# Patient Record
Sex: Male | Born: 1980
Health system: Southern US, Community
[De-identification: ages and names within clinical notes are randomized; demographics above are authoritative.]

## PROBLEM LIST (undated history)

## (undated) HISTORY — PX: NO PAST SURGERIES: SHX2092

---

## 2005-12-06 ENCOUNTER — Emergency Department (HOSPITAL_COMMUNITY): Admission: EM | Admit: 2005-12-06 | Discharge: 2005-12-06 | Payer: Self-pay | Admitting: Emergency Medicine

## 2009-05-02 ENCOUNTER — Emergency Department (HOSPITAL_COMMUNITY): Admission: EM | Admit: 2009-05-02 | Discharge: 2009-05-02 | Payer: Self-pay | Admitting: Emergency Medicine

## 2010-10-20 LAB — RAPID STREP SCREEN (MED CTR MEBANE ONLY): Streptococcus, Group A Screen (Direct): NEGATIVE

## 2013-11-26 ENCOUNTER — Ambulatory Visit (INDEPENDENT_AMBULATORY_CARE_PROVIDER_SITE_OTHER): Payer: 59 | Admitting: Family Medicine

## 2013-11-26 ENCOUNTER — Encounter: Payer: Self-pay | Admitting: Family Medicine

## 2013-11-26 VITALS — BP 132/94 | Ht 73.0 in | Wt 242.0 lb

## 2013-11-26 DIAGNOSIS — M7020 Olecranon bursitis, unspecified elbow: Secondary | ICD-10-CM

## 2013-11-26 DIAGNOSIS — M7030 Other bursitis of elbow, unspecified elbow: Secondary | ICD-10-CM

## 2013-11-26 NOTE — Progress Notes (Signed)
   Subjective:    Patient ID: Jeffrey Fischer, male    DOB: 1980/11/02, 33 y.o.   MRN: [id number]  HPILeft elbow pain and swelling for the past 2 months.   Swelled up two mo ago, does nt hurt  Larger at times  No otc meds  No hx in past  Review of Systems No other joint pain no injury. No fever no chills no rash ROS otherwise negative    Objective:   Physical Exam  Alert no apparent distress. Lungs clear. Heart regular in rhythm. H&T normal. Left elbow inflamed swollen. Nontender. Palpable fluid.  Patient was prepped draped anesthetized small amount of gel fluid expressed. Injected with half cc steroid Depo-Medrol and cc Xylocaine.    Assessment & Plan:  Impression chronic: Olecranon bursitis plan local measures discussed wellness exam encourage. WSL

## 2013-12-11 ENCOUNTER — Encounter: Payer: 59 | Admitting: Family Medicine

## 2014-02-16 ENCOUNTER — Ambulatory Visit (INDEPENDENT_AMBULATORY_CARE_PROVIDER_SITE_OTHER): Payer: 59 | Admitting: Family Medicine

## 2014-02-16 ENCOUNTER — Encounter: Payer: Self-pay | Admitting: Family Medicine

## 2014-02-16 VITALS — BP 112/74 | Temp 99.6°F | Ht 73.0 in | Wt 246.0 lb

## 2014-02-16 DIAGNOSIS — J329 Chronic sinusitis, unspecified: Secondary | ICD-10-CM

## 2014-02-16 MED ORDER — HYDROCODONE-HOMATROPINE 5-1.5 MG/5ML PO SYRP: ORAL_SOLUTION | ORAL | Status: DC

## 2014-02-16 MED ORDER — AMOXICILLIN-POT CLAVULANATE 875-125 MG PO TABS: 1.0000 | ORAL_TABLET | Freq: Two times a day (BID) | ORAL | Status: AC

## 2014-02-16 NOTE — Progress Notes (Signed)
   Subjective:    Patient ID: Jeffrey Fischer, male    DOB: May 12, 1981, 33 y.o.   MRN: [id number]  Cough This is a new problem. The current episode started in the past 7 days. The cough is non-productive. Associated symptoms include ear congestion, a fever, postnasal drip and shortness of breath.   mucinex prn, dinminished energy  otc cough med   Frontal headache. Low-grade fever. Diminished energy. Review of Systems  Constitutional: Positive for fever.  HENT: Positive for postnasal drip.   Respiratory: Positive for cough and shortness of breath.    No rash ROS otherwise negative    Objective:   Physical Exam  Alert mild malaise. Vitals reviewed. Temp 99.6 frontal maxillary tenderness. Left TM effusion noted pharynx normal. Neck supple lungs clear heart regular in rhythm.      Assessment & Plan:  Impression acute rhinosinusitis discussed plan Augmentin twice a day 10 days. Symptomatic care discussed. Patient states he has quit smoking. WSL

## 2015-05-06 ENCOUNTER — Encounter: Payer: 59 | Admitting: Family Medicine

## 2015-05-06 ENCOUNTER — Encounter: Payer: Self-pay | Admitting: Family Medicine

## 2015-05-06 ENCOUNTER — Ambulatory Visit (INDEPENDENT_AMBULATORY_CARE_PROVIDER_SITE_OTHER): Payer: 59 | Admitting: Family Medicine

## 2015-05-06 VITALS — BP 140/90 | Ht 73.0 in | Wt 252.0 lb

## 2015-05-06 DIAGNOSIS — Z0000 Encounter for general adult medical examination without abnormal findings: Secondary | ICD-10-CM

## 2015-05-06 DIAGNOSIS — R21 Rash and other nonspecific skin eruption: Secondary | ICD-10-CM | POA: Diagnosis not present

## 2015-05-06 DIAGNOSIS — E785 Hyperlipidemia, unspecified: Secondary | ICD-10-CM | POA: Diagnosis not present

## 2015-05-06 MED ORDER — MUPIROCIN 2 % EX OINT: TOPICAL_OINTMENT | CUTANEOUS | Status: DC

## 2015-05-06 MED ORDER — DOXYCYCLINE HYCLATE 100 MG PO TABS: 100.0000 mg | ORAL_TABLET | Freq: Two times a day (BID) | ORAL | Status: DC

## 2015-05-06 NOTE — Progress Notes (Signed)
   Subjective:    Patient ID: Jeffrey Fischer, male    DOB: [birth year], 34 y.o.   MRN: [id number]  HPI The patient comes in today for a wellness visit.    A review of their health history was completed.  A review of medications was also completed.  Any needed refills; no  Eating habits: not so good  Falls/  MVA accidents in past few months: no  Regular exercise: no  Specialist pt sees on regular basis: no  Preventative health issues were discussed.   Additional concerns: none   Stays very active working out a lot  Needs to have chol cked  Frequent boils, flares up in the groin area  1 has cropped up lately more aggravating. No obvious discharge.  Positive history of hyperlipidemia. Very strong family history of coronary artery disease. Patient wishes to assess level of numbers at this time.   Review of Systems  Constitutional: Negative for fever, activity change and appetite change.  HENT: Negative for congestion and rhinorrhea.   Eyes: Negative for discharge.  Respiratory: Negative for cough and wheezing.   Cardiovascular: Negative for chest pain.  Gastrointestinal: Negative for vomiting, abdominal pain and blood in stool.  Genitourinary: Negative for frequency and difficulty urinating.  Musculoskeletal: Negative for neck pain.  Skin: Negative for rash.  Allergic/Immunologic: Negative for environmental allergies and food allergies.  Neurological: Negative for weakness and headaches.  Psychiatric/Behavioral: Negative for agitation.  All other systems reviewed and are negative.      Objective:   Physical Exam  Constitutional: He appears well-developed and well-nourished.  HENT:  Head: Normocephalic and atraumatic.  Right Ear: External ear normal.  Left Ear: External ear normal.  Nose: Nose normal.  Mouth/Throat: Oropharynx is clear and moist.  Eyes: EOM are normal. Pupils are equal, round, and reactive to light.  Neck: Normal range of motion. Neck supple.  No thyromegaly present.  Cardiovascular: Normal rate, regular rhythm and normal heart sounds.   No murmur heard. Pulmonary/Chest: Effort normal and breath sounds normal. No respiratory distress. He has no wheezes.  Abdominal: Soft. Bowel sounds are normal. He exhibits no distension and no mass. There is no tenderness.  Genitourinary: Penis normal.  Musculoskeletal: Normal range of motion. He exhibits no edema.  Lymphadenopathy:    He has no cervical adenopathy.  Neurological: He is alert. He exhibits normal muscle tone.  Skin: Skin is warm and dry. No erythema.  Abscesses. Most resolved a couple active no obvious fluctuance  Psychiatric: He has a normal mood and affect. His behavior is normal. Judgment normal.  Vitals reviewed.         Assessment & Plan:  Impression 1 wellness exam #2 hyperlipidemia status uncertain #3 recurrent boils with one currently present. Discussed. Plan antibiotics prescribed. Appropriate blood work. Will screen sugar. Local measures discussed. Further recommendations based on blood work. WSL

## 2015-05-08 LAB — BASIC METABOLIC PANEL
BUN/Creatinine Ratio: 9 (ref 8–19)
BUN: 10 mg/dL (ref 6–20)
CO2: 22 mmol/L (ref 18–29)
Calcium: 9.2 mg/dL (ref 8.7–10.2)
Chloride: 99 mmol/L (ref 97–106)
Creatinine, Ser: 1.14 mg/dL (ref 0.76–1.27)
GFR calc Af Amer: 96 mL/min/{1.73_m2} (ref >59)
GFR calc non Af Amer: 83 mL/min/{1.73_m2} (ref >59)
Glucose: 160 mg/dL — ABNORMAL HIGH (ref 65–99)
Potassium: 4.5 mmol/L (ref 3.5–5.2)
Sodium: 140 mmol/L (ref 136–144)

## 2015-05-08 LAB — LIPID PANEL
Chol/HDL Ratio: 10.1 ratio units — ABNORMAL HIGH (ref 0.0–5.0)
Cholesterol, Total: 343 mg/dL — ABNORMAL HIGH (ref 100–199)
HDL: 34 mg/dL — ABNORMAL LOW (ref >39)
LDL Calculated: 263 mg/dL — ABNORMAL HIGH (ref 0–99)
Triglycerides: 230 mg/dL — ABNORMAL HIGH (ref 0–149)
VLDL Cholesterol Cal: 46 mg/dL — ABNORMAL HIGH (ref 5–40)

## 2015-05-08 LAB — HEPATIC FUNCTION PANEL
ALT: 162 IU/L — ABNORMAL HIGH (ref 0–44)
AST: 106 IU/L — ABNORMAL HIGH (ref 0–40)
Albumin: 4.5 g/dL (ref 3.5–5.5)
Alkaline Phosphatase: 76 IU/L (ref 39–117)
Bilirubin Total: 0.8 mg/dL (ref 0.0–1.2)
Bilirubin, Direct: 0.24 mg/dL (ref 0.00–0.40)
Total Protein: 7.2 g/dL (ref 6.0–8.5)

## 2015-05-18 ENCOUNTER — Ambulatory Visit: Payer: 59 | Admitting: Family Medicine

## 2015-05-19 ENCOUNTER — Ambulatory Visit: Payer: 59 | Admitting: Family Medicine

## 2015-07-07 ENCOUNTER — Encounter: Payer: Self-pay | Admitting: Family Medicine

## 2015-07-07 ENCOUNTER — Ambulatory Visit (INDEPENDENT_AMBULATORY_CARE_PROVIDER_SITE_OTHER): Payer: 59 | Admitting: Family Medicine

## 2015-07-07 VITALS — BP 120/86 | Temp 98.7°F | Ht 73.0 in | Wt 254.0 lb

## 2015-07-07 DIAGNOSIS — J019 Acute sinusitis, unspecified: Secondary | ICD-10-CM

## 2015-07-07 DIAGNOSIS — B9689 Other specified bacterial agents as the cause of diseases classified elsewhere: Secondary | ICD-10-CM

## 2015-07-07 MED ORDER — CEFTRIAXONE SODIUM 500 MG IJ SOLR
500.0000 mg | Freq: Once | INTRAMUSCULAR | Status: AC
  Administered 2015-07-07: 500 mg via INTRAMUSCULAR

## 2015-07-07 MED ORDER — AMOXICILLIN-POT CLAVULANATE 875-125 MG PO TABS: 1.0000 | ORAL_TABLET | Freq: Two times a day (BID) | ORAL | Status: DC

## 2015-07-07 NOTE — Progress Notes (Signed)
   Subjective:    Patient ID: Jeffrey Fischer, male    DOB: [birth year], 34 y.o.   MRN: [id number]  Sinusitis This is a new problem. The current episode started in the past 7 days. There has been no fever. Associated symptoms include congestion, coughing, ear pain, headaches and sinus pressure. (Runny nose) Treatments tried: Mucinex. The treatment provided no relief.   PMH benign relates severe sinus pressure pain discomfort in the sinuses denies neck symptoms relates moderate amount of coughing head congestion denies fever Patient states no other concerns this visit.   Review of Systems  Constitutional: Negative for fever and activity change.  HENT: Positive for congestion, ear pain, rhinorrhea and sinus pressure.   Eyes: Negative for discharge.  Respiratory: Positive for cough. Negative for wheezing.   Cardiovascular: Negative for chest pain.  Neurological: Positive for headaches.       Objective:   Physical Exam  Constitutional: He appears well-developed.  HENT:  Head: Normocephalic.  Mouth/Throat: Oropharynx is clear and moist. No oropharyngeal exudate.  Neck: Normal range of motion.  Cardiovascular: Normal rate, regular rhythm and normal heart sounds.   No murmur heard. Pulmonary/Chest: Effort normal and breath sounds normal. He has no wheezes.  Lymphadenopathy:    He has no cervical adenopathy.  Neurological: He exhibits normal muscle tone.  Skin: Skin is warm and dry.  Nursing note and vitals reviewed.     Due to the severity of his sinus infection patient was offered an injection in he stated he would prefer to go ahead and have injection today Rocephin shot given if high fevers progressive troubles or worse follow-up    Assessment & Plan:  Patient was seen today for upper respiratory illness. It is felt that the patient is dealing with sinusitis. Antibiotics were prescribed today. Importance of compliance with medication was discussed. Symptoms should gradually resolve  over the course of the next several days. If high fevers, progressive illness, difficulty breathing, worsening condition or failure for symptoms to improve over the next several days then the patient is to follow-up. If any emergent conditions the patient is to follow-up in the emergency department otherwise to follow-up in the office.

## 2015-11-30 DIAGNOSIS — R21 Rash and other nonspecific skin eruption: Secondary | ICD-10-CM | POA: Diagnosis not present

## 2016-02-18 ENCOUNTER — Telehealth: Payer: Self-pay | Admitting: Family Medicine

## 2016-02-18 ENCOUNTER — Encounter: Payer: Self-pay | Admitting: Family Medicine

## 2016-02-18 ENCOUNTER — Ambulatory Visit (INDEPENDENT_AMBULATORY_CARE_PROVIDER_SITE_OTHER): Payer: 59 | Admitting: Family Medicine

## 2016-02-18 VITALS — BP 118/74 | Temp 99.6°F | Ht 73.0 in | Wt 242.0 lb

## 2016-02-18 DIAGNOSIS — J683 Other acute and subacute respiratory conditions due to chemicals, gases, fumes and vapors: Secondary | ICD-10-CM

## 2016-02-18 DIAGNOSIS — Z0289 Encounter for other administrative examinations: Secondary | ICD-10-CM

## 2016-02-18 DIAGNOSIS — J019 Acute sinusitis, unspecified: Secondary | ICD-10-CM

## 2016-02-18 DIAGNOSIS — J4521 Mild intermittent asthma with (acute) exacerbation: Secondary | ICD-10-CM

## 2016-02-18 DIAGNOSIS — B9689 Other specified bacterial agents as the cause of diseases classified elsewhere: Secondary | ICD-10-CM

## 2016-02-18 MED ORDER — CLARITHROMYCIN 500 MG PO TABS: 500.0000 mg | ORAL_TABLET | Freq: Two times a day (BID) | ORAL | 0 refills | Status: DC

## 2016-02-18 MED ORDER — ALBUTEROL SULFATE HFA 108 (90 BASE) MCG/ACT IN AERS: 2.0000 | INHALATION_SPRAY | Freq: Four times a day (QID) | RESPIRATORY_TRACT | 2 refills | Status: DC | PRN

## 2016-02-18 MED ORDER — HYDROCODONE-HOMATROPINE 5-1.5 MG/5ML PO SYRP: ORAL_SOLUTION | ORAL | 0 refills | Status: DC

## 2016-02-18 NOTE — Telephone Encounter (Signed)
Patients wife who is a Marine scientist, told patient that he needs a z-pack as well sent in.

## 2016-02-18 NOTE — Telephone Encounter (Signed)
Need office visit per Dr Richardson Landry

## 2016-02-18 NOTE — Telephone Encounter (Signed)
[facility] notifying patient he needs an appointment.

## 2016-02-18 NOTE — Progress Notes (Signed)
   Subjective:    Patient ID: Jeffrey Fischer, male    DOB: 1981/05/09, 35 y.o.   MRN: [id number]  Cough  This is a new problem. Associated symptoms include a fever. Associated symptoms comments: congestion. He has tried nothing for the symptoms.   Pos bronch   aa  Review of Systems  Constitutional: Positive for fever.  Respiratory: Positive for cough.        Objective:   Physical Exam  .Alert active no acute distress lungs positive reactive airways with cough. Some bronchial sounds no crackles no tachypnea heart rare rhythm H&T normal      Assessment & Plan:  Impression subacute bronchitis with reactive airways. Temp is 99.6 cannot completely rule out walking pneumonia. Discussed. Plan Ventolin 2 sprays 4 times a day. Hycodan 1 teaspoon daily at bedtime when necessary for cough. Biaxin 500 twice a day 10 days symptom care discussed encouraged to avoid smoking WSL

## 2016-02-18 NOTE — Telephone Encounter (Signed)
Patient is requesting something called in for bad cough ,states its worst at night.[facility] [place].

## 2016-05-25 ENCOUNTER — Ambulatory Visit (INDEPENDENT_AMBULATORY_CARE_PROVIDER_SITE_OTHER): Payer: 59 | Admitting: Family Medicine

## 2016-05-25 ENCOUNTER — Encounter: Payer: Self-pay | Admitting: Family Medicine

## 2016-05-25 VITALS — BP 142/98 | Ht 73.0 in | Wt 251.4 lb

## 2016-05-25 DIAGNOSIS — M549 Dorsalgia, unspecified: Secondary | ICD-10-CM

## 2016-05-25 MED ORDER — METHYLPREDNISOLONE ACETATE 40 MG/ML IJ SUSP
40.0000 mg | Freq: Once | INTRAMUSCULAR | Status: AC
  Administered 2016-05-25: 40 mg via INTRAMUSCULAR

## 2016-05-25 MED ORDER — TIZANIDINE HCL 4 MG PO CAPS: 4.0000 mg | ORAL_CAPSULE | Freq: Three times a day (TID) | ORAL | 0 refills | Status: DC | PRN

## 2016-05-25 MED ORDER — ETODOLAC 400 MG PO TABS: ORAL_TABLET | ORAL | 0 refills | Status: DC

## 2016-05-25 NOTE — Progress Notes (Signed)
   Subjective:    Patient ID: Jeffrey Fischer, male    DOB: Dec 04, 1980, 35 y.o.   MRN: [id number]  Back Pain       Review of Systems  Musculoskeletal: Positive for back pain.       Objective:   Physical Exam        Assessment & Plan:

## 2016-05-25 NOTE — Progress Notes (Signed)
   Subjective:    Patient ID: MARTE ALLDAY, male    DOB: 1980/09/17, 35 y.o.   MRN: [id number]  Back Pain  This is a new problem. The current episode started in the past 7 days. The pain is present in the lumbar spine and thoracic spine. The pain does not radiate. The pain is at a severity of 9/10. The pain is severe. The symptoms are aggravated by sitting and lying down. Treatments tried: Advil.   Hx of remote back strain and now since then everyt three or four yrs it goes out  Felt real tight and painful low back on Sunday, left greater than right  Took advil prn Bad spasms since  Not goig down into the legs    + Patient states no other concerns this visit.   Review of Systems  Musculoskeletal: Positive for back pain.       Objective:   Physical Exam  Alert vitals stable, NAD. Blood pressure good on repeat. HEENT normal. Lungs clear. Heart regular rate and rhythm. Positive left lumbar tenderness impressive. No swelling. Negative straight leg raise      Assessment & Plan:  Impression lumbar strain with intermittent spasm. Patient would like "steroid shot" plan steroid shot plus anti-inflammatory plus muscle spasm plus local measures no x-rays are scan because this is likely deep muscles and ligaments. Regular exercise encourage after this abates

## 2016-09-26 ENCOUNTER — Other Ambulatory Visit: Payer: Self-pay

## 2016-09-26 ENCOUNTER — Encounter: Payer: Self-pay | Admitting: Family Medicine

## 2016-09-26 ENCOUNTER — Ambulatory Visit (INDEPENDENT_AMBULATORY_CARE_PROVIDER_SITE_OTHER): Payer: 59 | Admitting: Family Medicine

## 2016-09-26 VITALS — BP 112/80 | Temp 98.6°F | Ht 73.0 in | Wt 255.2 lb

## 2016-09-26 DIAGNOSIS — E784 Other hyperlipidemia: Secondary | ICD-10-CM

## 2016-09-26 DIAGNOSIS — Z79899 Other long term (current) drug therapy: Secondary | ICD-10-CM

## 2016-09-26 DIAGNOSIS — Z13220 Encounter for screening for lipoid disorders: Secondary | ICD-10-CM

## 2016-09-26 DIAGNOSIS — J019 Acute sinusitis, unspecified: Secondary | ICD-10-CM

## 2016-09-26 DIAGNOSIS — E785 Hyperlipidemia, unspecified: Secondary | ICD-10-CM | POA: Insufficient documentation

## 2016-09-26 DIAGNOSIS — Z131 Encounter for screening for diabetes mellitus: Secondary | ICD-10-CM

## 2016-09-26 DIAGNOSIS — E7849 Other hyperlipidemia: Secondary | ICD-10-CM

## 2016-09-26 MED ORDER — AMOXICILLIN-POT CLAVULANATE 875-125 MG PO TABS: 1.0000 | ORAL_TABLET | Freq: Two times a day (BID) | ORAL | 0 refills | Status: DC

## 2016-09-26 NOTE — Progress Notes (Signed)
   Subjective:    Patient ID: Jeffrey Fischer, male    DOB: 1980/12/20, 36 y.o.   MRN: [id number]  Sinusitis  This is a new problem. The current episode started in the past 7 days. The problem is unchanged. There has been no fever. The pain is moderate. Associated symptoms include coughing and a sore throat. (Runny nose) Past treatments include nothing. The treatment provided no relief.   Patient has no other concerns at this time.  Patient does state that he knows he needs to get lab work done this was done a couple years ago he failed to follow-up he's interested in getting lab work and doing a follow-up exam  Review of Systems  HENT: Positive for sore throat.   Respiratory: Positive for cough.   Patient denies wheezing high fever  Patient did have some intermittent chills past couple days feeling bad at times denies any headache or body aches     Objective:   Physical Exam  Lungs clear heart regular pulse normal mild sinus tenderness eardrums normal throat normal      Assessment & Plan:  Viral syndrome Secondary rhinosinusitis Antibiotics prescribed warning signs discussed   Lab work was ordered and patient was highly recommended to follow-up for a wellness checkup. This needs to be done somewhere within the next month he agrees

## 2017-01-24 ENCOUNTER — Ambulatory Visit (INDEPENDENT_AMBULATORY_CARE_PROVIDER_SITE_OTHER): Payer: 59 | Admitting: Family Medicine

## 2017-01-24 ENCOUNTER — Encounter: Payer: Self-pay | Admitting: Family Medicine

## 2017-01-24 VITALS — BP 146/86 | Ht 73.0 in | Wt 244.0 lb

## 2017-01-24 DIAGNOSIS — M62830 Muscle spasm of back: Secondary | ICD-10-CM

## 2017-01-24 MED ORDER — METHYLPREDNISOLONE ACETATE 40 MG/ML IJ SUSP
40.0000 mg | Freq: Once | INTRAMUSCULAR | Status: AC
  Administered 2017-01-24: 40 mg via INTRAMUSCULAR

## 2017-01-24 MED ORDER — TIZANIDINE HCL 4 MG PO CAPS: 4.0000 mg | ORAL_CAPSULE | Freq: Three times a day (TID) | ORAL | 1 refills | Status: DC | PRN

## 2017-01-24 MED ORDER — ETODOLAC 400 MG PO TABS: ORAL_TABLET | ORAL | 1 refills | Status: DC

## 2017-01-24 NOTE — Progress Notes (Signed)
   Subjective:    Patient ID: Jeffrey Fischer, male    DOB: Feb 21, 1981, 36 y.o.   MRN: [id number]  HPI  Patient arrives with c/o pulled muscle in back. Relates a lot of pain in the back on the right lower side hurts with rotation unable to play golf unable to work today denies radiation down the leg occurred while stretching denied any previous troubles has had a problem one time before with a muscle strain Review of Systems No hematuria no vomiting fever chills no diarrhea    Objective:   Physical Exam  Lungs clear hearts regular low back nontender negative straight leg raise mid back right side strain muscle difficult range of motion      Assessment & Plan:  Shot of steroids, stretching exercises, muscle relaxers when at home, anti-inflammatories for the next 5-10 days, follow-up if progressive troubles, no sign of ruptured disc or stone

## 2017-04-05 DIAGNOSIS — R21 Rash and other nonspecific skin eruption: Secondary | ICD-10-CM | POA: Diagnosis not present

## 2017-05-15 ENCOUNTER — Telehealth: Payer: Self-pay | Admitting: *Deleted

## 2017-05-15 ENCOUNTER — Other Ambulatory Visit: Payer: Self-pay | Admitting: *Deleted

## 2017-05-15 MED ORDER — METRONIDAZOLE 500 MG PO TABS: 500.0000 mg | ORAL_TABLET | Freq: Two times a day (BID) | ORAL | 0 refills | Status: DC

## 2017-05-15 NOTE — Telephone Encounter (Signed)
Pt walked in and wanted to talk with dr Richardson Landry. No appts available til 4:50. Discussed with pt that the dr was seeing pts. He states he will wait. Offered pt the 4:50 appt. He is going out of town and could not make it back up here. Pt states his wife just had a baby and was told today that she has trichomoniasis. Pt states he wants to have a discussion with the doctor about it. Pt made appt for next week with dr Richardson Landry. But would like to see if he needs treatment and if it could be called in to [facility] in [place] today before he goes out of town. Leaving this afternoon.

## 2017-05-15 NOTE — Telephone Encounter (Signed)
Flagyl 500 bid 7 d

## 2017-05-15 NOTE — Telephone Encounter (Signed)
Med sent to pharm. Pt notified.  

## 2017-05-23 ENCOUNTER — Ambulatory Visit: Payer: 59 | Admitting: Family Medicine

## 2017-05-24 ENCOUNTER — Ambulatory Visit: Payer: 59 | Admitting: Family Medicine

## 2017-06-19 ENCOUNTER — Encounter: Payer: Self-pay | Admitting: Family Medicine

## 2017-08-18 ENCOUNTER — Other Ambulatory Visit: Payer: Self-pay

## 2017-08-18 ENCOUNTER — Ambulatory Visit (HOSPITAL_COMMUNITY)
Admission: EM | Admit: 2017-08-18 | Discharge: 2017-08-18 | Disposition: A | Discharge status: Home / Self Care | Payer: 59 | Attending: Family Medicine | Admitting: Family Medicine

## 2017-08-18 ENCOUNTER — Encounter (HOSPITAL_COMMUNITY): Payer: Self-pay | Admitting: Emergency Medicine

## 2017-08-18 DIAGNOSIS — J0100 Acute maxillary sinusitis, unspecified: Secondary | ICD-10-CM

## 2017-08-18 MED ORDER — ALBUTEROL SULFATE HFA 108 (90 BASE) MCG/ACT IN AERS
2.0000 | INHALATION_SPRAY | RESPIRATORY_TRACT | 0 refills | Status: DC | PRN
Start: 2017-08-18 — End: 2020-02-12

## 2017-08-18 MED ORDER — METHYLPREDNISOLONE ACETATE 40 MG/ML IJ SUSP
INTRAMUSCULAR | Status: AC
  Filled 2017-08-18: qty 1

## 2017-08-18 MED ORDER — METHYLPREDNISOLONE ACETATE 40 MG/ML IJ SUSP
40.0000 mg | Freq: Once | INTRAMUSCULAR | Status: AC
  Administered 2017-08-18: 40 mg via INTRAMUSCULAR

## 2017-08-18 MED ORDER — AMOXICILLIN-POT CLAVULANATE 875-125 MG PO TABS: 1.0000 | ORAL_TABLET | Freq: Two times a day (BID) | ORAL | 0 refills | Status: AC

## 2017-08-18 NOTE — Discharge Instructions (Signed)
In treating her sinus infection with Augmentin for 10 days.  Please continue your other over-the-counter medications like Mucinex to continue to control your congestion.  You may also try Flonase nasal spray daily.  For your cough please use Delsym or Robitussin.  Alternate Tylenol and ibuprofen every 4 hours to help with fever and any pains you are having from coughing.  Use albuterol inhaler as needed for shortness of breath, wheezing.  We gave you an injection of steroids to help with your back pain.  Please continue with Tylenol and ibuprofen after this.  Please return if symptoms not improving with treatment, or worsening shortness of breath, difficulty breathing.

## 2017-08-18 NOTE — ED Provider Notes (Signed)
[facility]    CSN: [id number] Arrival date & time: 08/18/17  1203     History   Chief Complaint Chief Complaint  Patient presents with  . Nasal Congestion  . Facial Pain    HPI Jeffrey Fischer is a 37 y.o. male no significant Past medical history presenting today with nasal congestion, cough, chest congestion.  States he has sinus infections twice a year.  This began on Tuesday-5 days.  He has been using Mucinex with minimal relief.  Also endorsing decreased appetite.  Denying nausea, vomiting, diarrhea, abdominal pain.  Denies shortness of breath and chest pain.  States that he has thrown his back out from coughing.  He is requesting a steroid shot for this.  States he has had them in the past for back pain and they have helped.  HPI  History reviewed. No pertinent past medical history.  Patient Active Problem List   Diagnosis Date Noted  . Hyperlipidemia 09/26/2016    History reviewed. No pertinent surgical history.     Home Medications    Prior to Admission medications   Medication Sig Start Date End Date Taking? Authorizing Provider  albuterol (PROVENTIL HFA;VENTOLIN HFA) 108 (90 Base) MCG/ACT inhaler Inhale 2 puffs into the lungs every 4 (four) hours as needed for wheezing or shortness of breath. 08/18/17   Ragan Duhon C, PA-C  amoxicillin-clavulanate (AUGMENTIN) 875-125 MG tablet Take 1 tablet by mouth every 12 (twelve) hours for 10 days. 08/18/17 08/28/17  Amberrose Friebel C, PA-C  etodolac (LODINE) 400 MG tablet Take 1 tablet by mouth twice a day with food. Patient not taking: Reported on 08/18/2017 01/24/17   Kathyrn Drown, MD  HYDROcodone-homatropine [facility]) 5-1.5 MG/5ML syrup Take one tsp qhs prn cough Patient not taking: Reported on 05/25/2016 02/18/16   Mikey Kirschner, MD  metroNIDAZOLE (FLAGYL) 500 MG tablet Take 1 tablet (500 mg total) by mouth 2 (two) times daily with a meal. Patient not taking: Reported on 08/18/2017 05/15/17   Mikey Kirschner, MD   tiZANidine (ZANAFLEX) 4 MG capsule Take 1 capsule (4 mg total) by mouth 3 (three) times daily as needed for muscle spasms. Patient not taking: Reported on 08/18/2017 01/24/17   Kathyrn Drown, MD    Family History No family history on file.  Social History Social History   Tobacco Use  . Smoking status: Former Research scientist (life sciences)  . Smokeless tobacco: Former Systems developer    Quit date: 02/09/2014  Substance Use Topics  . Alcohol use: Yes  . Drug use: Not on file     Allergies   Patient has no known allergies.   Review of Systems Review of Systems  Constitutional: Positive for fatigue and fever. Negative for activity change and appetite change.  HENT: Positive for congestion, postnasal drip, rhinorrhea and sinus pressure. Negative for ear pain and sore throat.   Eyes: Negative for pain and itching.  Respiratory: Positive for cough. Negative for shortness of breath.   Cardiovascular: Negative for chest pain.  Gastrointestinal: Negative for abdominal pain, diarrhea, nausea and vomiting.  Musculoskeletal: Negative for myalgias.  Skin: Negative for rash.  Neurological: Negative for dizziness, light-headedness and headaches.     Physical Exam Triage Vital Signs ED Triage Vitals  Enc Vitals Group     BP 08/18/17 1227 (!) 144/83     Pulse Rate 08/18/17 1227 (!) 116     Resp 08/18/17 1227 20     Temp 08/18/17 1227 100.1 F (37.8 C)  Temp Source 08/18/17 1227 Oral     SpO2 08/18/17 1227 99 %     Weight --      Height --      Head Circumference --      Peak Flow --      Pain Score 08/18/17 1228 8     Pain Loc --      Pain Edu? --      Excl. in [place]? --    No data found.  Updated Vital Signs BP (!) 144/83 (BP Location: Left Arm) Comment: Reported BP to Nurse Kenton Kingfisher  Pulse (!) 116 Comment: reported HR to Nurse Kenton Kingfisher  Temp 100.1 F (37.8 C) (Oral) Comment: Reported temp to Nurse Kenton Kingfisher  Resp 20   SpO2 99%    Physical Exam  Constitutional: He appears well-developed and  well-nourished.  HENT:  Head: Normocephalic and atraumatic.  Right Ear: Tympanic membrane and ear canal normal.  Left Ear: Tympanic membrane and ear canal normal.  Nose: Rhinorrhea present. Right sinus exhibits maxillary sinus tenderness and frontal sinus tenderness. Left sinus exhibits maxillary sinus tenderness and frontal sinus tenderness.  Mouth/Throat: Uvula is midline and mucous membranes are normal. No oral lesions. No trismus in the jaw. No uvula swelling. Posterior oropharyngeal erythema present.  Visible drainage in posterior pharynx  Eyes: Conjunctivae are normal.  Neck: Neck supple.  Cardiovascular: Normal rate and regular rhythm.  No murmur heard. Pulmonary/Chest: Effort normal and breath sounds normal. No respiratory distress.  Clear to auscultation bilaterally  Musculoskeletal: He exhibits no edema.  Neurological: He is alert.  Skin: Skin is warm and dry.  Psychiatric: He has a normal mood and affect.  Nursing note and vitals reviewed.    UC Treatments / Results  Labs (all labs ordered are listed, but only abnormal results are displayed) Labs Reviewed - No data to display  EKG  EKG Interpretation None       Radiology No results found.  Procedures Procedures (including critical care time)  Medications Ordered in UC Medications  methylPREDNISolone acetate (DEPO-MEDROL) injection 40 mg (40 mg Intramuscular Given 08/18/17 1303)     Initial Impression / Assessment and Plan / UC Course  I have reviewed the triage vital signs and the nursing notes.  Pertinent labs & imaging results that were available during my care of the patient were reviewed by me and considered in my medical decision making (see chart for details).     Patient presents with symptoms likely from sinusitis, bacterial versus viral.  Given patient's low-grade fever and tachycardia we will go ahead and treat with Augmentin although he is not at the 10-day mark.  Do not suspect underlying  cardiopulmonary process. Symptoms seem unlikely related to ACS, CHF or COPD exacerbations, pneumonia, pneumothorax. Patient is nontoxic appearing and not in need of emergent medical intervention.  Lungs clear to auscultation-patient feels wheezing and chest tightness more so in the morning.  Will provide albuterol inhaler to use as needed.  Depo-Medrol shot provided for his back pain per his request.  Steet Tylenol and ibuprofen for any further pain. Recommended symptom control with over the counter medications: Daily oral anti-histamine, Oral decongestant or IN corticosteroid, saline irrigations, cepacol lozenges, Robitussin, Delsym, honey tea.  Return if symptoms fail to improve in 1-2 weeks or you develop shortness of breath, chest pain, severe headache. Patient states understanding and is agreeable.Discussed strict return precautions. Patient verbalized understanding and is agreeable with plan.   Discharged with PCP followup.  Final Clinical Impressions(s) / UC Diagnoses   Final diagnoses:  Acute maxillary sinusitis, recurrence not specified    ED Discharge Orders        Ordered    albuterol (PROVENTIL HFA;VENTOLIN HFA) 108 (90 Base) MCG/ACT inhaler  Every 4 hours PRN     08/18/17 1256    amoxicillin-clavulanate (AUGMENTIN) 875-125 MG tablet  Every 12 hours     08/18/17 1256       Controlled Substance Prescriptions [place] Controlled Substance Registry consulted? Not Applicable   Janith Lima, [place] 08/18/17 1338

## 2017-08-18 NOTE — ED Triage Notes (Signed)
Pt c/o nasal congestion, facial pain since Tuesday, been taking mucinnex without relief. Also c/o chest congestion.

## 2017-12-17 ENCOUNTER — Encounter: Payer: Self-pay | Admitting: Family Medicine

## 2017-12-17 ENCOUNTER — Ambulatory Visit (INDEPENDENT_AMBULATORY_CARE_PROVIDER_SITE_OTHER): Payer: 59 | Admitting: Family Medicine

## 2017-12-17 VITALS — BP 138/88 | Ht 73.0 in | Wt 243.8 lb

## 2017-12-17 DIAGNOSIS — E7849 Other hyperlipidemia: Secondary | ICD-10-CM | POA: Diagnosis not present

## 2017-12-17 DIAGNOSIS — Z0000 Encounter for general adult medical examination without abnormal findings: Secondary | ICD-10-CM

## 2017-12-17 DIAGNOSIS — G5603 Carpal tunnel syndrome, bilateral upper limbs: Secondary | ICD-10-CM

## 2017-12-17 NOTE — Progress Notes (Signed)
   Subjective:    Patient ID: Jeffrey Fischer, male    DOB: [birth year], 37 y.o.   MRN: [id number] Patient presents for wellness exam plus chronic concerns HPI  The patient comes in today for a wellness visit.    A review of their health history was completed.  A review of medications was also completed.  Any needed refills; no  Eating habits: health conscious   Falls/  MVA accidents in past few months: none  Regular exercise: yes  Specialist pt sees on regular basis: no  Preventative health issues were discussed.   Additional concerns: none  Overall staying exercis e three to four times per wk  physicall ctive with work also   Diet fair but not great   Smokes half a k a day or so   Tries to keep [employer] e 1 progressive numbness in both hands.  Right more than left.  Occurs with excessive work.  Also occurs at nighttime.  Has to shake it out in order to get function and sensation back.  Review of Systems  Constitutional: Negative for activity change, appetite change and fever.  HENT: Negative for congestion and rhinorrhea.   Eyes: Negative for discharge.  Respiratory: Negative for cough and wheezing.   Cardiovascular: Negative for chest pain.  Gastrointestinal: Negative for abdominal pain, blood in stool and vomiting.  Genitourinary: Negative for difficulty urinating and frequency.  Musculoskeletal: Negative for neck pain.  Skin: Negative for rash.  Allergic/Immunologic: Negative for environmental allergies and food allergies.  Neurological: Negative for weakness and headaches.  Psychiatric/Behavioral: Negative for agitation.  All other systems reviewed and are negative.      Objective:   Physical Exam  Constitutional: He appears well-developed and well-nourished.  HENT:  Head: Normocephalic and atraumatic.  Right Ear: External ear normal.  Left Ear: External ear normal.  Nose: Nose normal.  Mouth/Throat: Oropharynx is clear and moist.  Eyes: Pupils  are equal, round, and reactive to light. EOM are normal.  Neck: Normal range of motion. Neck supple. No thyromegaly present.  Cardiovascular: Normal rate, regular rhythm and normal heart sounds.  No murmur heard. Pulmonary/Chest: Effort normal and breath sounds normal. No respiratory distress. He has no wheezes.  Abdominal: Soft. Bowel sounds are normal. He exhibits no distension and no mass. There is no tenderness.  Genitourinary: Penis normal.  Musculoskeletal: Normal range of motion. He exhibits no edema.  Lymphadenopathy:    He has no cervical adenopathy.  Neurological: He is alert. He exhibits normal muscle tone.  Skin: Skin is warm and dry. No erythema.  Psychiatric: He has a normal mood and affect. His behavior is normal. Judgment normal.  Vitals reviewed.   Positive Phalen's sign right hand.  Sensation  slight diminished forefinger in mid finger right hand.  Negative Tinel sign      Assessment & Plan:  1 wellness exam.  Diet discussed.  Exercise discussed.  Appropriate blood work discussed.  Patient generally does not get flu shots.  Cessation discussed and encouraged  2.  Hyperlipidemia.  Severe in nature.  With strong family history of premature coronary artery disease long discussion held.  If this work reveals elevation will recommend medication  3.  Carpal tunnel syndrome.  Bilateral right greater than left.  Recommend nighttime splints rationale discussed appropriate use discussed

## 2018-05-29 ENCOUNTER — Ambulatory Visit (INDEPENDENT_AMBULATORY_CARE_PROVIDER_SITE_OTHER): Payer: 59 | Admitting: Family Medicine

## 2018-05-29 ENCOUNTER — Encounter: Payer: Self-pay | Admitting: Family Medicine

## 2018-05-29 VITALS — BP 124/80 | Temp 101.4°F | Ht 73.0 in | Wt 241.6 lb

## 2018-05-29 DIAGNOSIS — J029 Acute pharyngitis, unspecified: Secondary | ICD-10-CM | POA: Diagnosis not present

## 2018-05-29 DIAGNOSIS — J019 Acute sinusitis, unspecified: Secondary | ICD-10-CM

## 2018-05-29 DIAGNOSIS — G5603 Carpal tunnel syndrome, bilateral upper limbs: Secondary | ICD-10-CM

## 2018-05-29 LAB — POCT RAPID STREP A (OFFICE): RAPID STREP A SCREEN: NEGATIVE

## 2018-05-29 MED ORDER — MUPIROCIN 2 % EX OINT
1.0000 | TOPICAL_OINTMENT | Freq: Two times a day (BID) | CUTANEOUS | 0 refills | Status: DC
Start: 2018-05-29 — End: 2019-05-23

## 2018-05-29 MED ORDER — AZITHROMYCIN 250 MG PO TABS: ORAL_TABLET | ORAL | 0 refills | Status: DC

## 2018-05-29 NOTE — Progress Notes (Signed)
   Subjective:    Patient ID: Jeffrey Fischer, male    DOB: 1981-05-28, 37 y.o.   MRN: [id number]  Sore Throat   This is a new problem. The current episode started in the past 7 days. The pain is worse on the left side. Treatments tried: TheraFlu. The treatment provided no relief.   Results for orders placed or performed in visit on 05/29/18  POCT rapid strep A  Result Value Ref Range   Rapid Strep A Screen Negative Negative   Throat was itchy and a ot o f dranage  Feeling cod chills   And nausea, no energy    No  Appetite     No enregy    Patient seen in the past for carpal tunnel syndrome.  States now getting worse.  Would like to see a specialist.  Review of Systems No headache, no major weight loss or weight gain, no chest pain no back pain abdominal pain no change in bowel habits complete ROS otherwise negative     Objective:   Physical Exam Alert, mild malaise. Hydration good Vitals stable. frontal/ maxillary tenderness evident positive nasal congestion. pharynx normal neck supple  lungs clear/no crackles or wheezes. heart regular in rhythm Hands bilateral Tinel sign and Phalen sign grip intact       Assessment & Plan:  Impression 1 Worsening Carpal Tunl. syndrome.  Will get referral to specialist per patient request  Impression rhinosinusitis likely post viral, discussed with patient. plan antibiotics prescribed. Questions answered. Symptomatic care discussed. warning signs discussed. WSL

## 2018-05-30 LAB — SPECIMEN STATUS REPORT

## 2018-05-30 LAB — STREP A DNA PROBE: Strep Gp A Direct, DNA Probe: NEGATIVE

## 2018-08-19 ENCOUNTER — Telehealth: Payer: Self-pay | Admitting: Family Medicine

## 2018-08-19 NOTE — Telephone Encounter (Signed)
Patient is not currently on medication for weight loss and has not been prescribed it by [place] in Epic. Patient scheduled office visit to discuss weight loss medication.

## 2018-08-19 NOTE — Telephone Encounter (Signed)
Requesting medication of Adipex weight loss, advise.   Pharmacy:  [facility] [address] [place] [id number]

## 2018-08-21 ENCOUNTER — Encounter: Payer: Self-pay | Admitting: Family Medicine

## 2018-08-21 ENCOUNTER — Ambulatory Visit (INDEPENDENT_AMBULATORY_CARE_PROVIDER_SITE_OTHER): Payer: No Typology Code available for payment source | Admitting: Family Medicine

## 2018-08-21 VITALS — BP 136/88 | Ht 73.0 in | Wt 247.8 lb

## 2018-08-21 DIAGNOSIS — E7849 Other hyperlipidemia: Secondary | ICD-10-CM | POA: Diagnosis not present

## 2018-08-21 DIAGNOSIS — E669 Obesity, unspecified: Secondary | ICD-10-CM | POA: Diagnosis not present

## 2018-08-21 MED ORDER — PHENTERMINE HCL 37.5 MG PO CAPS: ORAL_CAPSULE | ORAL | 2 refills | Status: DC

## 2018-08-21 NOTE — Progress Notes (Signed)
   Subjective:    Patient ID: Jeffrey Fischer, male    DOB: July 08, 1981, 38 y.o.   MRN: [id number]  HPI  Patient arrives to discuss weight loss medication. Patient arrives and would like to try Adipex for weight loss. Patient has not been on this med before but would like to try it to help with weight loss.  Has tried tp cut down calories. Working out three tiers per week    Has not been able to ose any more weight   Frustrated  Has a friend with phentermine who has benefited very much.  heating  Review of Systems No headache, no major weight loss or weight gain, no chest pain no back pain abdominal pain no change in bowel habits complete ROS otherwise negative     Objective:   Physical Exam   Alert vitals stable, NAD. Blood pressure good on repeat. HEENT normal. Lungs clear. Heart regular rate and rhythm.      Assessment & Plan:  Impression obesity.  BMI 32.7.  This qualifies for phentermine.  Discussed.  Not to use without increased effort with exercise and diet.  Patient agrees to this.  3 months worth follow-up as scheduled side effects benefits discussed

## 2018-09-10 ENCOUNTER — Ambulatory Visit (INDEPENDENT_AMBULATORY_CARE_PROVIDER_SITE_OTHER): Payer: No Typology Code available for payment source | Admitting: Family Medicine

## 2018-09-10 ENCOUNTER — Encounter: Payer: Self-pay | Admitting: Family Medicine

## 2018-09-10 VITALS — Temp 102.1°F | Wt 251.0 lb

## 2018-09-10 DIAGNOSIS — J111 Influenza due to unidentified influenza virus with other respiratory manifestations: Secondary | ICD-10-CM

## 2018-09-10 MED ORDER — ONDANSETRON 4 MG PO TBDP: 4.0000 mg | ORAL_TABLET | Freq: Three times a day (TID) | ORAL | 0 refills | Status: DC | PRN

## 2018-09-10 MED ORDER — OSELTAMIVIR PHOSPHATE 75 MG PO CAPS: 75.0000 mg | ORAL_CAPSULE | Freq: Two times a day (BID) | ORAL | 0 refills | Status: AC

## 2018-09-10 NOTE — Progress Notes (Signed)
   Subjective:    Patient ID: Jeffrey Fischer, male    DOB: 1980/09/27, 38 y.o.   MRN: [id number]  Influenza  This is a new problem. The current episode started yesterday. Associated symptoms include chills, coughing, a fever, headaches, nausea, a sore throat and vomiting.   Woke up yest with coogh  Chills last night    Then ran a fever   Felt bad today   Chills all over  No flu shot this yr or any  Bad cough   Pos vomiting    Review of Systems  Constitutional: Positive for chills and fever.  HENT: Positive for sore throat.   Respiratory: Positive for cough.   Gastrointestinal: Positive for nausea and vomiting.  Neurological: Positive for headaches.       Objective:   Physical Exam  Alert vitals reviewed, moderate malaise. Hydration good. Positive nasal congestion lungs no crackles or wheezes, no tachypnea, intermittent bronchial cough during exam heart regular rate and rhythm.       Assessment & Plan:  Impression influenza discussed at length. Petra Kuba of illness and potential sequela discussed. Plan Tamiflu prescribed if indicated and timing appropriate. Symptom care discussed. Warning signs discussed. WSL

## 2019-02-19 ENCOUNTER — Other Ambulatory Visit: Payer: Self-pay

## 2019-02-19 ENCOUNTER — Ambulatory Visit (INDEPENDENT_AMBULATORY_CARE_PROVIDER_SITE_OTHER): Payer: No Typology Code available for payment source | Admitting: Family Medicine

## 2019-02-19 DIAGNOSIS — Z79899 Other long term (current) drug therapy: Secondary | ICD-10-CM | POA: Diagnosis not present

## 2019-02-19 DIAGNOSIS — Z114 Encounter for screening for human immunodeficiency virus [HIV]: Secondary | ICD-10-CM

## 2019-02-19 DIAGNOSIS — Z113 Encounter for screening for infections with a predominantly sexual mode of transmission: Secondary | ICD-10-CM | POA: Diagnosis not present

## 2019-02-19 DIAGNOSIS — E7849 Other hyperlipidemia: Secondary | ICD-10-CM | POA: Diagnosis not present

## 2019-02-19 NOTE — Progress Notes (Signed)
   Subjective:    Patient ID: Jeffrey Fischer, male    DOB: Jun 30, 1981, 38 y.o.   MRN: [id number]  HPI Pt is having some personal issues that he would like to discuss with provider.   Virtual Visit via Video Note  I connected with Jeffrey Fischer on 02/19/19 at  9:30 AM EDT by a video enabled telemedicine application and verified that I am speaking with the correct person using two identifiers.  Location: Patient: home Provider: office   I discussed the limitations of evaluation and management by telemedicine and the availability of in person appointments. The patient expressed understanding and agreed to proceed.  History of Present Illness:    Observations/Objective:   Assessment and Plan:   Follow Up Instructions:    I discussed the assessment and treatment plan with the patient. The patient was provided an opportunity to ask questions and all were answered. The patient agreed with the plan and demonstrated an understanding of the instructions.   The patient was advised to call back or seek an in-person evaluation if the symptoms worsen or if the condition fails to improve as anticipated.  I provided 20 minutes of non-face-to-face time during this encounter.   Vicente Males, LPN  Patient has history of elevated cholesterol.  Has not been watching his diet.  Would like to know where his levels are currently are.  Exercising some.  Also reports unprotected sex with a different partner.  Would like to be tested for potential STD  Review of Systems No headache no chest pain no abdominal pain    Objective:   Physical Exam  Virtual     Assessment & Plan:  Impression 1 hyperlipidemia.  Diet discussed exercise discussed check blood work  2.  Unprotected sex with different partner will check test per patient request

## 2019-02-22 ENCOUNTER — Encounter: Payer: Self-pay | Admitting: Family Medicine

## 2019-02-26 LAB — HEPATIC FUNCTION PANEL
ALT: 77 IU/L — ABNORMAL HIGH (ref 0–44)
AST: 100 IU/L — ABNORMAL HIGH (ref 0–40)
Albumin: 4.5 g/dL (ref 4.0–5.0)
Alkaline Phosphatase: 75 IU/L (ref 39–117)
Bilirubin Total: 1.1 mg/dL (ref 0.0–1.2)
Bilirubin, Direct: 0.25 mg/dL (ref 0.00–0.40)
Total Protein: 7.3 g/dL (ref 6.0–8.5)

## 2019-02-26 LAB — LIPID PANEL
Chol/HDL Ratio: 8.1 ratio — ABNORMAL HIGH (ref 0.0–5.0)
Cholesterol, Total: 315 mg/dL — ABNORMAL HIGH (ref 100–199)
HDL: 39 mg/dL — ABNORMAL LOW (ref >39)
LDL Calculated: 228 mg/dL — ABNORMAL HIGH (ref 0–99)
Triglycerides: 240 mg/dL — ABNORMAL HIGH (ref 0–149)
VLDL Cholesterol Cal: 48 mg/dL — ABNORMAL HIGH (ref 5–40)

## 2019-02-26 LAB — CBC WITH DIFFERENTIAL/PLATELET
Basophils Absolute: 0 10*3/uL (ref 0.0–0.2)
Basos: 1 %
EOS (ABSOLUTE): 0.1 10*3/uL (ref 0.0–0.4)
Eos: 2 %
Hematocrit: 46.9 % (ref 37.5–51.0)
Hemoglobin: 16.6 g/dL (ref 13.0–17.7)
Immature Grans (Abs): 0 10*3/uL (ref 0.0–0.1)
Immature Granulocytes: 0 %
Lymphocytes Absolute: 1.6 10*3/uL (ref 0.7–3.1)
Lymphs: 33 %
MCH: 33 pg (ref 26.6–33.0)
MCHC: 35.4 g/dL (ref 31.5–35.7)
MCV: 93 fL (ref 79–97)
Monocytes Absolute: 0.4 10*3/uL (ref 0.1–0.9)
Monocytes: 8 %
Neutrophils Absolute: 2.8 10*3/uL (ref 1.4–7.0)
Neutrophils: 56 %
Platelets: 160 10*3/uL (ref 150–450)
RBC: 5.03 x10E6/uL (ref 4.14–5.80)
RDW: 12.8 % (ref 11.6–15.4)
WBC: 5 10*3/uL (ref 3.4–10.8)

## 2019-02-26 LAB — BASIC METABOLIC PANEL
BUN/Creatinine Ratio: 9 (ref 9–20)
BUN: 10 mg/dL (ref 6–20)
CO2: 23 mmol/L (ref 20–29)
Calcium: 8.8 mg/dL (ref 8.7–10.2)
Chloride: 100 mmol/L (ref 96–106)
Creatinine, Ser: 1.08 mg/dL (ref 0.76–1.27)
GFR calc Af Amer: 99 mL/min/{1.73_m2} (ref >59)
GFR calc non Af Amer: 86 mL/min/{1.73_m2} (ref >59)
Glucose: 91 mg/dL (ref 65–99)
Potassium: 4.3 mmol/L (ref 3.5–5.2)
Sodium: 138 mmol/L (ref 134–144)

## 2019-02-26 LAB — HEMOGLOBIN A1C
Est. average glucose Bld gHb Est-mCnc: 105 mg/dL
Hgb A1c MFr Bld: 5.3 % (ref 4.8–5.6)

## 2019-02-26 LAB — VDRL: Non-Treponemal Screening VDRL: NORMAL

## 2019-02-26 LAB — HIV ANTIBODY (ROUTINE TESTING W REFLEX): HIV Screen 4th Generation wRfx: NONREACTIVE

## 2019-02-27 LAB — GC/CHLAMYDIA PROBE AMP
Chlamydia trachomatis, NAA: NEGATIVE
Neisseria Gonorrhoeae by PCR: NEGATIVE

## 2019-02-28 ENCOUNTER — Other Ambulatory Visit: Payer: Self-pay | Admitting: *Deleted

## 2019-02-28 ENCOUNTER — Telehealth: Payer: Self-pay | Admitting: Family Medicine

## 2019-02-28 MED ORDER — ROSUVASTATIN CALCIUM 10 MG PO TABS: 10.0000 mg | ORAL_TABLET | Freq: Every day | ORAL | 5 refills | Status: DC

## 2019-02-28 NOTE — Telephone Encounter (Signed)
Pt had labs completed on 02/21/2019. Please advise. Thank you

## 2019-02-28 NOTE — Telephone Encounter (Signed)
Pt is calling checking on status of lab results.

## 2019-02-28 NOTE — Telephone Encounter (Signed)
See lab message 

## 2019-02-28 NOTE — Telephone Encounter (Signed)
Discussed with pt. See labs

## 2019-05-23 ENCOUNTER — Telehealth: Payer: Self-pay | Admitting: Family Medicine

## 2019-05-23 MED ORDER — MUPIROCIN 2 % EX OINT: 1.0000 "application " | TOPICAL_OINTMENT | Freq: Two times a day (BID) | CUTANEOUS | 3 refills | Status: DC

## 2019-05-23 NOTE — Telephone Encounter (Signed)
Please advise. Thank you

## 2019-05-23 NOTE — Telephone Encounter (Signed)
May have Bactroban ointment as requested for this refill uses same directions as the last one but instead of no refills please put 3 refills

## 2019-05-23 NOTE — Telephone Encounter (Signed)
Patient is requesting refill on mupirocin ointment called into [facility] [place]

## 2019-05-23 NOTE — Telephone Encounter (Signed)
Pt returned call and verbalized understanding  

## 2019-05-23 NOTE — Telephone Encounter (Signed)
Refills sent in. Tried to contact pt but voicemail is full

## 2019-08-18 ENCOUNTER — Encounter: Payer: Self-pay | Admitting: Family Medicine

## 2019-12-02 ENCOUNTER — Telehealth: Payer: Self-pay | Admitting: Family Medicine

## 2019-12-02 MED ORDER — MUPIROCIN 2 % EX OINT: TOPICAL_OINTMENT | CUTANEOUS | 2 refills | Status: DC

## 2019-12-02 NOTE — Telephone Encounter (Signed)
Medication sent to pharmacy and pt is aware 

## 2019-12-02 NOTE — Telephone Encounter (Signed)
Sure plus 2 ref

## 2019-12-02 NOTE — Telephone Encounter (Signed)
Please advise. Thank you

## 2019-12-02 NOTE — Telephone Encounter (Signed)
Patient is requesting refill on Bactroban cream 2% called into [facility]

## 2020-02-12 ENCOUNTER — Other Ambulatory Visit: Payer: Self-pay

## 2020-02-12 ENCOUNTER — Emergency Department: Payer: No Typology Code available for payment source

## 2020-02-12 ENCOUNTER — Encounter: Payer: Self-pay | Admitting: Emergency Medicine

## 2020-02-12 ENCOUNTER — Emergency Department
Admission: EM | Admit: 2020-02-12 | Discharge: 2020-02-12 | Disposition: A | Discharge status: Home / Self Care | Payer: No Typology Code available for payment source | Attending: Emergency Medicine | Admitting: Emergency Medicine

## 2020-02-12 DIAGNOSIS — R519 Headache, unspecified: Secondary | ICD-10-CM | POA: Diagnosis not present

## 2020-02-12 LAB — CBC
HCT: 45.5 % (ref 39.0–52.0)
Hemoglobin: 16.8 g/dL (ref 13.0–17.0)
MCH: 34.6 pg — ABNORMAL HIGH (ref 26.0–34.0)
MCHC: 36.9 g/dL — ABNORMAL HIGH (ref 30.0–36.0)
MCV: 93.6 fL (ref 80.0–100.0)
Platelets: 152 10*3/uL (ref 150–400)
RBC: 4.86 MIL/uL (ref 4.22–5.81)
RDW: 11.8 % (ref 11.5–15.5)
WBC: 6 10*3/uL (ref 4.0–10.5)
nRBC: 0 % (ref 0.0–0.2)

## 2020-02-12 LAB — BASIC METABOLIC PANEL
Anion gap: 9 (ref 5–15)
BUN: 12 mg/dL (ref 6–20)
CO2: 27 mmol/L (ref 22–32)
Calcium: 9 mg/dL (ref 8.9–10.3)
Chloride: 101 mmol/L (ref 98–111)
Creatinine, Ser: 1.16 mg/dL (ref 0.61–1.24)
GFR calc Af Amer: >60 mL/min (ref >60)
GFR calc non Af Amer: >60 mL/min (ref >60)
Glucose, Bld: 188 mg/dL — ABNORMAL HIGH (ref 70–99)
Potassium: 4.2 mmol/L (ref 3.5–5.1)
Sodium: 137 mmol/L (ref 135–145)

## 2020-02-12 MED ORDER — IOHEXOL 300 MG/ML  SOLN
100.0000 mL | Freq: Once | INTRAMUSCULAR | Status: AC | PRN
  Administered 2020-02-12: 100 mL via INTRAVENOUS
  Filled 2020-02-12: qty 100

## 2020-02-12 MED ORDER — CYCLOBENZAPRINE HCL 5 MG PO TABS
ORAL_TABLET | ORAL | 0 refills | Status: DC
Start: 2020-02-12 — End: 2021-08-15

## 2020-02-12 MED ORDER — IBUPROFEN 600 MG PO TABS
600.0000 mg | ORAL_TABLET | Freq: Four times a day (QID) | ORAL | 0 refills | Status: DC | PRN
Start: 2020-02-12 — End: 2021-08-15

## 2020-02-12 MED ORDER — ACETAMINOPHEN 325 MG PO TABS
650.0000 mg | ORAL_TABLET | Freq: Once | ORAL | Status: AC
  Administered 2020-02-12: 650 mg via ORAL
  Filled 2020-02-12: qty 2
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
## 2020-03-04 ENCOUNTER — Encounter: Payer: Self-pay | Admitting: Family Medicine

## 2020-03-04 ENCOUNTER — Ambulatory Visit (INDEPENDENT_AMBULATORY_CARE_PROVIDER_SITE_OTHER): Payer: No Typology Code available for payment source | Admitting: Family Medicine

## 2020-03-04 ENCOUNTER — Other Ambulatory Visit: Payer: Self-pay

## 2020-03-04 VITALS — BP 132/88 | HR 89 | Temp 97.0°F | Ht 73.0 in | Wt 245.8 lb

## 2020-03-04 DIAGNOSIS — M545 Low back pain, unspecified: Secondary | ICD-10-CM

## 2020-03-04 DIAGNOSIS — M25562 Pain in left knee: Secondary | ICD-10-CM

## 2020-03-04 DIAGNOSIS — S161XXD Strain of muscle, fascia and tendon at neck level, subsequent encounter: Secondary | ICD-10-CM

## 2020-03-04 DIAGNOSIS — S0033XD Contusion of nose, subsequent encounter: Secondary | ICD-10-CM
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
## 2020-03-26 ENCOUNTER — Other Ambulatory Visit: Payer: Self-pay | Admitting: Family Medicine

## 2020-03-26 ENCOUNTER — Other Ambulatory Visit: Payer: Self-pay

## 2020-03-26 ENCOUNTER — Telehealth: Payer: Self-pay | Admitting: Family Medicine

## 2020-03-26 ENCOUNTER — Ambulatory Visit (HOSPITAL_COMMUNITY)
Admission: RE | Admit: 2020-03-26 | Discharge: 2020-03-26 | Disposition: A | Discharge status: Home / Self Care | Payer: Self-pay | Source: Ambulatory Visit | Attending: Family Medicine | Admitting: Family Medicine

## 2020-03-26 DIAGNOSIS — M545 Low back pain, unspecified: Secondary | ICD-10-CM

## 2020-03-26 DIAGNOSIS — M25562 Pain in left knee: Secondary | ICD-10-CM

## 2020-03-26 DIAGNOSIS — S0033XD Contusion of nose, subsequent encounter: Secondary | ICD-10-CM | POA: Insufficient documentation

## 2020-03-26 NOTE — Telephone Encounter (Signed)
Done. Radiologist changed order.
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
## 2020-03-26 NOTE — Telephone Encounter (Signed)
Hello, yes please put in order for xray, nasal bones. Thx. Dr. Lovena Le

## 2020-03-30 ENCOUNTER — Encounter: Payer: Self-pay | Admitting: Family Medicine

## 2020-04-01 ENCOUNTER — Ambulatory Visit (HOSPITAL_COMMUNITY): Payer: No Typology Code available for payment source | Attending: Family Medicine | Admitting: Physical Therapy

## 2020-04-01 ENCOUNTER — Other Ambulatory Visit: Payer: Self-pay

## 2020-04-01 ENCOUNTER — Encounter (HOSPITAL_COMMUNITY): Payer: Self-pay | Admitting: Physical Therapy

## 2020-04-01 DIAGNOSIS — M5416 Radiculopathy, lumbar region: Secondary | ICD-10-CM | POA: Diagnosis not present

## 2020-04-01 DIAGNOSIS — R262 Difficulty in walking, not elsewhere classified: Secondary | ICD-10-CM | POA: Diagnosis present
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
## 2020-04-08 ENCOUNTER — Ambulatory Visit (HOSPITAL_COMMUNITY): Payer: No Typology Code available for payment source | Admitting: Physical Therapy

## 2020-04-08 ENCOUNTER — Other Ambulatory Visit: Payer: Self-pay

## 2020-04-08 DIAGNOSIS — M5416 Radiculopathy, lumbar region: Secondary | ICD-10-CM | POA: Diagnosis not present

## 2020-04-08 DIAGNOSIS — R262 Difficulty in walking, not elsewhere classified: Secondary | ICD-10-CM

## 2020-04-08 NOTE — Therapy (Signed)
[place], [place], [address] Phone: [phone]   Fax:  [phone]  Physical Therapy Treatment  Patient Details  Name: Jeffrey Fischer MRN: [id number] Date of Birth: [birth year] Referring Provider (PT): Elvia Collum    Encounter Date: 04/08/2020   PT End of Session - 04/08/20 1113    Visit Number 2    Number of Visits 12    Date for PT Re-Evaluation 05/20/20    Authorization Type Med pay    Progress Note Due on Visit 10    PT Start Time 0838    PT Stop Time 0916    PT Time Calculation (min) 38 min    Activity Tolerance Patient tolerated treatment well    Behavior During Therapy [facility] for tasks assessed/performed           No past medical history on file.  No past surgical history on file.  There were no vitals filed for this visit.   Subjective Assessment - 04/08/20 1046    Subjective pt states his back is doing better today; states his Lt shoulder and neck have been bothering him and unsure if it's the way he slept or something else.  No other issues; reports compliance with HEP.    Currently in Pain? Yes    Pain Score 3     Pain Location Back    Pain Orientation Right;Mid;Lower    Pain Descriptors / Indicators Sore;Tightness                             [facility] Adult PT Treatment/Exercise - 04/08/20 0001      Lumbar Exercises: Stretches   Single Knee to Chest Stretch Right;Left;3 reps;20 seconds    Lower Trunk Rotation 5 reps    Piriformis Stretch Right;Left;30 seconds;60 seconds;Limitations    Piriformis Stretch Limitations too uncomfortable in seated; done supine with stretch felt just crossing leg over other; held 1 minute      Lumbar Exercises: Standing   Heel Raises 10 reps    Functional Squats 10 reps    Scapular Retraction 10 reps      Lumbar Exercises: Supine   Ab Set 10 reps    Bent Knee Raise 10 reps    Bridge 10 reps    Straight Leg Raise 10 reps    Straight Leg  Raises Limitations with abd bracing      Lumbar Exercises: Prone   Other Prone Lumbar Exercises POE 1 minute      Manual Therapy   Manual Therapy Soft tissue mobilization    Manual therapy comments completed in prone; done seperate from all other aspects of treatment     Soft tissue mobilization to decrease tightness and pain                   PT Education - 04/08/20 1114    Education Details HEP and goal review; POC moving forward    Person(s) Educated Patient    Methods Explanation    Comprehension Verbalized understanding            PT Short Term Goals - 04/08/20 1114      PT SHORT TERM GOAL #1   Title PT to be I in HEP to improve pain to no greater than a 6/10    Time 3    Period Weeks    Status On-going    Target Date 05/06/20  due to therapy pt is unable to start for 2 weeks     PT SHORT TERM GOAL #2   Title PT core and LE mobility to improve to allow to squat to pick an item off of the floor.    Time 3    Period Weeks    Status New             PT Long Term Goals - 04/08/20 1114      PT LONG TERM GOAL #1   Title PT to be completing an advance HEP to allow pt pain to be no greater than a 2/10    Time 6    Period Weeks    Status On-going      PT LONG TERM GOAL #2   Title PT to have normal lumbar ROM to be able to complete work activity    Time 6    Period Weeks    Status On-going      PT LONG TERM GOAL #3   Title PT core and LE strength to be at least 4+/5 to allow pt to complete his work activities    Time 4    Period Weeks    Status On-going      PT LONG TERM GOAL #4   Title PT to be able to sit, stand or walk for over an hour to allow community interaction    Time 6    Period Weeks    Status On-going                 Plan - 04/08/20 1115    Clinical Impression Statement Pt returns today with overall improvement in lumbar and new complaints of Lt upper trap and cervical pain.  Suggested he return to MD regarding this if  continued as this is a new issue. Goals, HEP reviewed as well as POC moving forward.    Began standing exercises with cues for general form and completing more slowly.  Pt without any complaints or issues with therex completed today and able to complete POE for 1 minute with reduced symptoms.  Attempted seated piriformis, however too tight and uncomfortable to complete.  Changed to supine with stretch obtained with just crossing LE over another.  Encouraged to do this at home.   Manual continued to Rt lumbar paraspinals into Rt glute region with general tightness but without significant spasms palpated.  Pt reported reduced pain following manual therapy.    Personal Factors and Comorbidities Behavior Pattern    Examination-Activity Limitations Bend;Carry;Lift;Locomotion Level;Sit;Squat;Stairs;Stand    Examination-Participation Restrictions Cleaning;Community Activity;Shop;Occupation    Stability/Clinical Decision Making Evolving/Moderate complexity    Rehab Potential Good    PT Frequency 2x / week    PT Duration 6 weeks    PT Treatment/Interventions Therapeutic activities;Therapeutic exercise;Patient/family education;Manual techniques;Dry needling    PT Next Visit Plan Progress exercises as tolerated.  Continue with manual to reduced tightness and pain.    PT Home Exercise Plan standing side bend; LTR, knee to chest , abset           Patient will benefit from skilled therapeutic intervention in order to improve the following deficits and impairments:  Decreased activity tolerance, Decreased range of motion, Difficulty walking, Decreased strength, Hypomobility, Increased fascial restricitons, Increased muscle spasms, Impaired flexibility, Pain  Visit Diagnosis: Radiculopathy, lumbar region  Difficulty in walking, not elsewhere classified     Problem List Patient Active Problem List   Diagnosis Date Noted  . Hyperlipidemia 09/26/2016  Teena Irani, PTA/CLT [phone]  Teena Irani 04/08/2020, 11:16 AM  [place], [place], [address] Phone: [phone]   Fax:  [phone]  Name: Jeffrey Fischer MRN: [id number] Date of Birth: 1981-04-13

## 2020-04-13 ENCOUNTER — Ambulatory Visit (INDEPENDENT_AMBULATORY_CARE_PROVIDER_SITE_OTHER): Payer: No Typology Code available for payment source | Admitting: Orthopaedic Surgery

## 2020-04-13 ENCOUNTER — Encounter: Payer: Self-pay | Admitting: Orthopaedic Surgery

## 2020-04-13 ENCOUNTER — Other Ambulatory Visit: Payer: Self-pay

## 2020-04-13 VITALS — BP 156/113 | HR 93 | Ht 73.0 in | Wt 255.0 lb

## 2020-04-13 DIAGNOSIS — G8929 Other chronic pain: Secondary | ICD-10-CM

## 2020-04-13 DIAGNOSIS — M25562 Pain in left knee: Secondary | ICD-10-CM
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
## 2020-04-13 NOTE — Patient Instructions (Addendum)
Worried about a torn meniscus with your knee symptoms   MRI scan has been ordered for you, we will get approval for the scan if needed, then Forestine Na will call you with appointment. We will see you in 2 weeks for a follow up visit to review the MRI scan    [phone] is phone number to call

## 2020-04-15 ENCOUNTER — Telehealth (HOSPITAL_COMMUNITY): Payer: Self-pay | Admitting: Physical Therapy

## 2020-04-15 ENCOUNTER — Ambulatory Visit (HOSPITAL_COMMUNITY): Payer: Self-pay | Admitting: Physical Therapy

## 2020-04-15 NOTE — Telephone Encounter (Signed)
pt called and lmonvm to cx today's appt.

## 2020-04-20 ENCOUNTER — Other Ambulatory Visit: Payer: Self-pay

## 2020-04-20 ENCOUNTER — Encounter (HOSPITAL_COMMUNITY): Payer: Self-pay | Admitting: Physical Therapy

## 2020-04-20 ENCOUNTER — Ambulatory Visit (HOSPITAL_COMMUNITY): Payer: No Typology Code available for payment source | Attending: Family Medicine | Admitting: Physical Therapy

## 2020-04-20 DIAGNOSIS — R262 Difficulty in walking, not elsewhere classified: Secondary | ICD-10-CM | POA: Diagnosis present

## 2020-04-20 DIAGNOSIS — M5416 Radiculopathy, lumbar region: Secondary | ICD-10-CM | POA: Insufficient documentation

## 2020-04-20 NOTE — Therapy (Signed)
[facility] [address] [place], [place], [address] Phone: [phone]   Fax:  [phone]  Physical Therapy Treatment  Patient Details  Name: Jeffrey Fischer MRN: [id number] Date of Birth: 1981/04/26 Referring Provider (PT): Elvia Collum    Encounter Date: 04/20/2020   PT End of Session - 04/20/20 0842    Visit Number 3    Number of Visits 12    Date for PT Re-Evaluation 05/20/20    Authorization Type Med pay    Progress Note Due on Visit 10    PT Start Time 0835    PT Stop Time 0910    PT Time Calculation (min) 35 min    Activity Tolerance Patient tolerated treatment well    Behavior During Therapy [facility] for tasks assessed/performed           History reviewed. No pertinent past medical history.  History reviewed. No pertinent surgical history.  There were no vitals filed for this visit.   Subjective Assessment - 04/20/20 0842    Subjective Patient says things are going well. Says he felt sore in his low back for 2 days after doing prone on elbows stretch. Says he is waiting on an MRI for his knee.    Currently in Pain? No/denies                             [facility] Adult PT Treatment/Exercise - 04/20/20 0001      Lumbar Exercises: Stretches   Double Knee to Chest Stretch 5 reps;10 seconds    Lower Trunk Rotation 5 reps;10 seconds      Lumbar Exercises: Supine   Ab Set 10 reps    Bent Knee Raise 10 reps    Bridge 10 reps      Manual Therapy   Manual Therapy Joint mobilization    Manual therapy comments completed in prone; done seperate from all other aspects of treatment     Joint Mobilization Grade I-II P/A mobs to mid thoracic and lumbar spine, patient in prone, for pain and restrictions                     PT Short Term Goals - 04/08/20 1114      PT SHORT TERM GOAL #1   Title PT to be I in HEP to improve pain to no greater than a 6/10    Time 3    Period Weeks    Status On-going     Target Date 05/06/20   due to therapy pt is unable to start for 2 weeks     PT SHORT TERM GOAL #2   Title PT core and LE mobility to improve to allow to squat to pick an item off of the floor.    Time 3    Period Weeks    Status New             PT Long Term Goals - 04/08/20 1114      PT LONG TERM GOAL #1   Title PT to be completing an advance HEP to allow pt pain to be no greater than a 2/10    Time 6    Period Weeks    Status On-going      PT LONG TERM GOAL #2   Title PT to have normal lumbar ROM to be able to complete work activity    Time 6  Period Weeks    Status On-going      PT LONG TERM GOAL #3   Title PT core and LE strength to be at least 4+/5 to allow pt to complete his work activities    Time 4    Period Weeks    Status On-going      PT LONG TERM GOAL #4   Title PT to be able to sit, stand or walk for over an hour to allow community interaction    Time 6    Period Weeks    Status On-going                 Plan - 04/20/20 1321    Clinical Impression Statement Patient tolerated session well overall today. Held lumbar extension exercise per patient subjective of increasing pain. Added DKTC strength for lumbar flexion, patient noted improved mobility. Patient showed good return for all other table exercise but did note mild discomfort with hip bridging. Added manual joint mobs grade I-II for improved lumbar and thoracic mobility and decreased pain. Patient reports decreased pain and stiffness post treatment.    Personal Factors and Comorbidities Behavior Pattern    Examination-Activity Limitations Bend;Carry;Lift;Locomotion Level;Sit;Squat;Stairs;Stand    Examination-Participation Restrictions Cleaning;Community Activity;Shop;Occupation    Stability/Clinical Decision Making Evolving/Moderate complexity    Rehab Potential Good    PT Frequency 2x / week    PT Duration 6 weeks    PT Treatment/Interventions Therapeutic activities;Therapeutic  exercise;Patient/family education;Manual techniques;Dry needling    PT Next Visit Plan Progress exercises as tolerated.  Continue with manual to reduced tightness and pain.    PT Home Exercise Plan standing side bend; LTR, knee to chest , abset    Consulted and Agree with Plan of Care Patient           Patient will benefit from skilled therapeutic intervention in order to improve the following deficits and impairments:  Decreased activity tolerance, Decreased range of motion, Difficulty walking, Decreased strength, Hypomobility, Increased fascial restricitons, Increased muscle spasms, Impaired flexibility, Pain  Visit Diagnosis: Radiculopathy, lumbar region  Difficulty in walking, not elsewhere classified     Problem List Patient Active Problem List   Diagnosis Date Noted   Hyperlipidemia 09/26/2016   1:31 PM, 04/20/20 Josue Hector PT DPT  Physical Therapist with [facility]  [phone] [place], [place], [address] Phone: [phone]   Fax:  [phone]  Name: VINCIENT Fischer MRN: [id number] Date of Birth: 1981-04-07

## 2020-04-22 ENCOUNTER — Telehealth (HOSPITAL_COMMUNITY): Payer: Self-pay | Admitting: Physical Therapy

## 2020-04-22 ENCOUNTER — Ambulatory Visit (HOSPITAL_COMMUNITY): Payer: No Typology Code available for payment source | Admitting: Physical Therapy

## 2020-04-22 NOTE — Telephone Encounter (Signed)
Pt did not show for appointment.  Unable to reach by phone to leave a voicemail  Teena Irani, PTA/CLT [phone]

## 2020-04-23 ENCOUNTER — Ambulatory Visit (HOSPITAL_COMMUNITY): Admission: RE | Admit: 2020-04-23 | Payer: No Typology Code available for payment source | Source: Ambulatory Visit

## 2020-04-27 ENCOUNTER — Telehealth (HOSPITAL_COMMUNITY): Payer: Self-pay | Admitting: Physical Therapy

## 2020-04-27 ENCOUNTER — Ambulatory Visit (HOSPITAL_COMMUNITY): Payer: No Typology Code available for payment source | Admitting: Physical Therapy

## 2020-04-27 NOTE — Telephone Encounter (Signed)
Pt did not show for appointment (2nd consecutive NS).  Explained NS policy and reminded of next appt.; Pt cancelled next appt due to conflicting MD appt.  Pt reported he would be at next therapy appt. Next week.   Teena Irani, PTA/CLT [phone]

## 2020-04-29 ENCOUNTER — Ambulatory Visit: Payer: No Typology Code available for payment source | Admitting: Orthopaedic Surgery

## 2020-04-29 ENCOUNTER — Encounter (HOSPITAL_COMMUNITY): Payer: Self-pay

## 2020-05-04 ENCOUNTER — Ambulatory Visit (HOSPITAL_COMMUNITY): Payer: No Typology Code available for payment source

## 2020-05-04 ENCOUNTER — Telehealth (HOSPITAL_COMMUNITY): Payer: Self-pay

## 2020-05-04 NOTE — Telephone Encounter (Signed)
No show #3.  Called and spoke to pt who stated he thought next apt was scheduled for Friday.  Pt explained per no show policy he is to be discharged.  Explained that if he feels need to return to therapy to contact MD for new referral.  All remaining apt were cancelled per policy.  Ihor Austin, LPTA/CLT; Delana Meyer [phone]

## 2020-05-06 ENCOUNTER — Ambulatory Visit (HOSPITAL_COMMUNITY): Payer: No Typology Code available for payment source

## 2020-05-11 ENCOUNTER — Encounter (HOSPITAL_COMMUNITY): Payer: Self-pay | Admitting: Physical Therapy

## 2020-05-13 ENCOUNTER — Encounter (HOSPITAL_COMMUNITY): Payer: Self-pay | Admitting: Physical Therapy

## 2020-05-18 ENCOUNTER — Encounter (HOSPITAL_COMMUNITY): Payer: Self-pay | Admitting: Physical Therapy

## 2020-05-20 ENCOUNTER — Encounter (HOSPITAL_COMMUNITY): Payer: Self-pay | Admitting: Physical Therapy

## 2020-07-19 ENCOUNTER — Telehealth: Payer: Self-pay

## 2020-07-19 MED ORDER — MUPIROCIN 2 % EX OINT: TOPICAL_OINTMENT | CUTANEOUS | 2 refills | Status: DC

## 2020-07-19 NOTE — Telephone Encounter (Signed)
Refills sent, patient notified.  °

## 2020-07-19 NOTE — Telephone Encounter (Signed)
Patient calling to get a refill on mupirocin ointment sent to [facility] in [place].

## 2020-07-19 NOTE — Telephone Encounter (Signed)
I am fine with him getting 3 additional refills

## 2020-07-19 NOTE — Telephone Encounter (Signed)
Called pt to find out why he needs this and he states dr Brett Canales would give him 2 tubes at a time for spots on back of legs. Uses as needed. No fever. States he does not remember what he called the spots and I could not find any notes in chart. Dr Brett Canales last sent in this cream on 5/18 with 2 refills.

## 2020-12-08 ENCOUNTER — Other Ambulatory Visit (HOSPITAL_COMMUNITY): Payer: Self-pay | Admitting: Emergency Medicine

## 2020-12-08 ENCOUNTER — Ambulatory Visit (HOSPITAL_COMMUNITY)
Admission: RE | Admit: 2020-12-08 | Discharge: 2020-12-08 | Disposition: A | Discharge status: Home / Self Care | Payer: No Typology Code available for payment source | Source: Ambulatory Visit | Attending: Emergency Medicine | Admitting: Emergency Medicine

## 2020-12-08 ENCOUNTER — Other Ambulatory Visit: Payer: Self-pay

## 2020-12-08 DIAGNOSIS — M79641 Pain in right hand: Secondary | ICD-10-CM

## 2020-12-08 DIAGNOSIS — M25531 Pain in right wrist: Secondary | ICD-10-CM
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
## 2021-03-11 ENCOUNTER — Ambulatory Visit (INDEPENDENT_AMBULATORY_CARE_PROVIDER_SITE_OTHER): Payer: No Typology Code available for payment source | Admitting: Family Medicine

## 2021-03-11 ENCOUNTER — Other Ambulatory Visit: Payer: Self-pay

## 2021-03-11 VITALS — BP 137/85 | HR 80 | Wt 254.2 lb

## 2021-03-11 DIAGNOSIS — K644 Residual hemorrhoidal skin tags: Secondary | ICD-10-CM

## 2021-03-11 MED ORDER — MUPIROCIN 2 % EX OINT: TOPICAL_OINTMENT | CUTANEOUS | 2 refills | Status: DC

## 2021-03-11 MED ORDER — PROCTOFOAM HC 1-1 % EX FOAM: 1.0000 | Freq: Two times a day (BID) | CUTANEOUS | 1 refills | Status: DC

## 2021-03-11 NOTE — Progress Notes (Signed)
   Patient ID: Jeffrey Fischer, male    DOB: [birth year], 41 y.o.   MRN: [id number]   Chief Complaint  Patient presents with   Rectal Bleeding    3-4 weeks - requesting referral to GI   Subjective:    HPI Going on 4-6 wks with blood from rectal area. Seeing blood in toilet often after having bm. Sometimes it is dripping into toilet.  Not having any dizziness or weakness. Has h/o external hemorrhoids. Has a lump in rectal area. Not tried any meds otc for it. No h/o diarrhea.  Going bm 2x per day Drinking lots of water. Pt stating eating good amt of fiber in the diet.  No new diet changes.  No h/o constipation.  No family history colon cancer.  Medical History Abdellah has no past medical history on file.   Outpatient Encounter Medications as of 03/11/2021  Medication Sig   hydrocortisone-pramoxine (PROCTOFOAM HC) rectal foam Place 1 applicator rectally 2 (two) times daily.   cyclobenzaprine (FLEXERIL) 5 MG tablet Take 1-2 tablets 3 times daily as needed   ibuprofen (ADVIL) 600 MG tablet Take 1 tablet (600 mg total) by mouth every 6 (six) hours as needed.   mupirocin ointment (BACTROBAN) 2 % Apply to affected areas BID   rosuvastatin (CRESTOR) 10 MG tablet Take 1 tablet (10 mg total) by mouth at bedtime.   [DISCONTINUED] mupirocin ointment (BACTROBAN) 2 % Apply to affected areas BID   No facility-administered encounter medications on file as of 03/11/2021.     Review of Systems  Constitutional:  Negative for chills and fever.  HENT:  Negative for congestion, rhinorrhea and sore throat.   Respiratory:  Negative for cough, shortness of breath and wheezing.   Cardiovascular:  Negative for chest pain and leg swelling.  Gastrointestinal:  Positive for blood in stool and rectal pain. Negative for abdominal pain, diarrhea, nausea and vomiting.  Genitourinary:  Negative for dysuria and frequency.  Skin:  Negative for rash.  Neurological:  Negative for dizziness, weakness and headaches.     Vitals BP 137/85   Pulse 80   Wt 254 lb 3.2 oz (115.3 kg)   SpO2 100%   BMI 33.54 kg/m   Objective:   Physical Exam Vitals and nursing note reviewed.  Constitutional:      General: He is not in acute distress.    Appearance: Normal appearance.  Genitourinary:    Comments: Declining rectal exam. Musculoskeletal:        General: Normal range of motion.  Neurological:     General: No focal deficit present.     Mental Status: He is alert and oriented to person, place, and time.  Psychiatric:        Mood and Affect: Mood normal.        Behavior: Behavior normal.     Assessment and Plan   1. Bleeding external hemorrhoids - Ambulatory referral to Gastroenterology   Not on cholesterol meds.  Advised pt to return for wellness exam and labs with new provider.  Pt in agreement.  Pt declining rectal exam. Pt stating not having severe pain, advised to return if bleeding is worsening or more pain.  Gave info on sitz bath, stool softner, water, fiber diet, and use proctofoam or prep H. Pt requesting a GI referral.  Given. Pt in agreement with plan.  Return in about 6 months (around 09/11/2021), or if symptoms worsen or fail to improve, for wellness exam.

## 2021-03-15 ENCOUNTER — Telehealth: Payer: Self-pay

## 2021-03-15 ENCOUNTER — Telehealth: Payer: Self-pay | Admitting: Family Medicine

## 2021-03-15 MED ORDER — HYDROCORTISONE (PERIANAL) 2.5 % EX CREA: 1.0000 "application " | TOPICAL_CREAM | Freq: Two times a day (BID) | CUTANEOUS | 1 refills | Status: DC

## 2021-03-15 NOTE — Telephone Encounter (Signed)
Fax from [facility] stating that the Proctofoam rectal foam is currently unavailable. They have lidocaine 3%, hydrocortisone 0.5% and Proctormed 2.5%. please advise. Thank you.

## 2021-03-15 NOTE — Telephone Encounter (Signed)
Tried calling patient , mailbox is full .   Hi Pls call pt and let him know that [facility] doesn't treat external hemorrhoids and would need to be referred to surgeon.   If he wants this, then pls refer to general surgery for external hemorrhoids.   Did he get better or improving with any of the treatments given?  Or is he still seeing a lot of blood with bowel movements?   Thx.   Dr. Lovena Le

## 2021-03-16 ENCOUNTER — Telehealth: Payer: Self-pay | Admitting: Family Medicine

## 2021-03-16 NOTE — Telephone Encounter (Signed)
Jeffrey Fischer, Malena M, DO  P [facility]  Pls call pt and let him know that [facility] doesn't treat external hemorrhoids and would need to be referred to surgeon.   If he wants this, then pls refer to general surgery for external hemorrhoids.   Did he get better or improving with any of the treatments given?  Or is he still seeing a lot of blood with bowel movements?   Thx.   Dr. Lovena Fischer   Pt contacted via my chart

## 2021-03-16 NOTE — Telephone Encounter (Signed)
Mychart message sent to patient.

## 2021-03-22 NOTE — Telephone Encounter (Signed)
Pt contacted. Pt states he will get in touch with Dr.Rourke himself and set up appt.

## 2021-08-15 ENCOUNTER — Ambulatory Visit (INDEPENDENT_AMBULATORY_CARE_PROVIDER_SITE_OTHER): Payer: No Typology Code available for payment source | Admitting: Family Medicine

## 2021-08-15 ENCOUNTER — Encounter: Payer: Self-pay | Admitting: Family Medicine

## 2021-08-15 ENCOUNTER — Other Ambulatory Visit: Payer: Self-pay

## 2021-08-15 VITALS — BP 142/100 | HR 100 | Temp 97.2°F | Ht 73.0 in | Wt 259.0 lb

## 2021-08-15 DIAGNOSIS — R739 Hyperglycemia, unspecified: Secondary | ICD-10-CM

## 2021-08-15 DIAGNOSIS — K625 Hemorrhage of anus and rectum: Secondary | ICD-10-CM | POA: Diagnosis not present

## 2021-08-15 DIAGNOSIS — E7849 Other hyperlipidemia: Secondary | ICD-10-CM

## 2021-08-15 NOTE — Patient Instructions (Signed)
We will be in touch about your urgent referral.  Labs today.  Take care  Dr. Lacinda Axon

## 2021-08-15 NOTE — Assessment & Plan Note (Signed)
Suspect internal hemorrhoids.  CBC today.  Urgent referral to GI.  Needs colonoscopy.

## 2021-08-15 NOTE — Progress Notes (Signed)
Subjective:  Patient ID: Jeffrey Fischer, male    DOB: [birth year]  Age: 41 y.o. MRN: [id number]  CC: Chief Complaint  Patient presents with   Rectal Bleeding    Hx of hemerroids past 8 months, pt has been seeing a lot of blood in stool / shower- worse in 2 days     HPI:  41 year old male presents for evaluation the above.  Patient reports that he has had ongoing rectal bleeding since summer 2022.  He states that initially it was just with wiping.  He states that he has intermittently had rectal bleeding of a more significant amount.  This has been worse over the past week.  He states that he notes severe rectal bleeding especially after bowel movements.  He states that he has difficulty getting it to stop.  He states that he has to take a shower given the amount of blood.  He reports associated rectal pain.  He has a history of hemorrhoids.  Denies abdominal pain.  No reports of syncope.  No other associated symptoms.  Patient Active Problem List   Diagnosis Date Noted   Rectal bleeding 08/15/2021   Hyperlipidemia 09/26/2016    Social Hx   Social History   Socioeconomic History   Marital status: Married    Spouse name: Not on file   Number of children: Not on file   Years of education: Not on file   Highest education level: Not on file  Occupational History   Not on file  Tobacco Use   Smoking status: Former   Smokeless tobacco: Former    Quit date: 02/09/2014  Substance and Sexual Activity   Alcohol use: Yes   Drug use: Not on file   Sexual activity: Not on file  Other Topics Concern   Not on file  Social History Narrative   Not on file   Social Determinants of Health   Financial Resource Strain: Not on file  Food Insecurity: Not on file  Transportation Needs: Not on file  Physical Activity: Not on file  Stress: Not on file  Social Connections: Not on file    Review of Systems Per HPI  Objective:  BP (!) 142/100    Pulse 100    Temp (!) 97.2 F (36.2 C)     Ht $R'6\' 1"'PO$  (1.854 m)    Wt 259 lb (117.5 kg)    SpO2 98%    BMI 34.17 kg/m   BP/Weight 08/15/2021 03/11/2021 09/11/3333  Systolic BP 456 256 389  Diastolic BP 373 85 428  Wt. (Lbs) 259 254.2 255  BMI 34.17 33.54 33.64    Physical Exam Vitals and nursing note reviewed.  Constitutional:      General: He is not in acute distress.    Appearance: Normal appearance. He is not ill-appearing.  HENT:     Head: Normocephalic and atraumatic.  Eyes:     General:        Right eye: No discharge.        Left eye: No discharge.     Conjunctiva/sclera: Conjunctivae normal.  Pulmonary:     Effort: Pulmonary effort is normal. No respiratory distress.  Genitourinary:    Comments: Skin tags from prior external hemorrhoids noted.  No anal fissure. Neurological:     Mental Status: He is alert.  Psychiatric:        Mood and Affect: Mood normal.        Behavior: Behavior normal.    Lab Results  Component Value Date   WBC 6.0 02/12/2020   HGB 16.8 02/12/2020   HCT 45.5 02/12/2020   PLT 152 02/12/2020   GLUCOSE 188 (H) 02/12/2020   CHOL 315 (H) 02/21/2019   TRIG 240 (H) 02/21/2019   HDL 39 (L) 02/21/2019   LDLCALC 228 (H) 02/21/2019   ALT 77 (H) 02/21/2019   AST 100 (H) 02/21/2019   NA 137 02/12/2020   K 4.2 02/12/2020   CL 101 02/12/2020   CREATININE 1.16 02/12/2020   BUN 12 02/12/2020   CO2 27 02/12/2020   HGBA1C 5.3 02/21/2019     Assessment & Plan:   Problem List Items Addressed This Visit       Digestive   Rectal bleeding - Primary    Suspect internal hemorrhoids.  CBC today.  Urgent referral to GI.  Needs colonoscopy.      Relevant Orders   CBC   Ambulatory referral to Gastroenterology     Other   Hyperlipidemia   Relevant Orders   Lipid panel   Other Visit Diagnoses     Hyperglycemia       Relevant Orders   CMP14+EGFR   Hemoglobin A1c       Thersa Salt DO [facility]

## 2021-08-16 LAB — CMP14+EGFR
ALT: 64 IU/L — ABNORMAL HIGH (ref 0–44)
AST: 46 IU/L — ABNORMAL HIGH (ref 0–40)
Albumin/Globulin Ratio: 1.5 (ref 1.2–2.2)
Albumin: 4.4 g/dL (ref 4.0–5.0)
Alkaline Phosphatase: 85 IU/L (ref 44–121)
BUN/Creatinine Ratio: 10 (ref 9–20)
BUN: 10 mg/dL (ref 6–24)
Bilirubin Total: 1.1 mg/dL (ref 0.0–1.2)
CO2: 23 mmol/L (ref 20–29)
Calcium: 9.1 mg/dL (ref 8.7–10.2)
Chloride: 103 mmol/L (ref 96–106)
Creatinine, Ser: 1.03 mg/dL (ref 0.76–1.27)
Globulin, Total: 3 g/dL (ref 1.5–4.5)
Glucose: 121 mg/dL — ABNORMAL HIGH (ref 70–99)
Potassium: 4.3 mmol/L (ref 3.5–5.2)
Sodium: 142 mmol/L (ref 134–144)
Total Protein: 7.4 g/dL (ref 6.0–8.5)
eGFR: 94 mL/min/{1.73_m2} (ref >59)

## 2021-08-16 LAB — LIPID PANEL
Chol/HDL Ratio: 9.7 ratio — ABNORMAL HIGH (ref 0.0–5.0)
Cholesterol, Total: 291 mg/dL — ABNORMAL HIGH (ref 100–199)
HDL: 30 mg/dL — ABNORMAL LOW
LDL Chol Calc (NIH): 214 mg/dL — ABNORMAL HIGH (ref 0–99)
Triglycerides: 237 mg/dL — ABNORMAL HIGH (ref 0–149)
VLDL Cholesterol Cal: 47 mg/dL — ABNORMAL HIGH (ref 5–40)

## 2021-08-16 LAB — HEMOGLOBIN A1C
Est. average glucose Bld gHb Est-mCnc: 131 mg/dL
Hgb A1c MFr Bld: 6.2 % — ABNORMAL HIGH (ref 4.8–5.6)

## 2021-08-16 LAB — CBC
Hematocrit: 45.6 % (ref 37.5–51.0)
Hemoglobin: 15.8 g/dL (ref 13.0–17.7)
MCH: 32.4 pg (ref 26.6–33.0)
MCHC: 34.6 g/dL (ref 31.5–35.7)
MCV: 94 fL (ref 79–97)
Platelets: 181 x10E3/uL (ref 150–450)
RBC: 4.87 x10E6/uL (ref 4.14–5.80)
RDW: 11.8 % (ref 11.6–15.4)
WBC: 4.5 x10E3/uL (ref 3.4–10.8)

## 2021-08-17 ENCOUNTER — Other Ambulatory Visit: Payer: No Typology Code available for payment source

## 2021-08-17 ENCOUNTER — Other Ambulatory Visit: Payer: Self-pay | Admitting: Family Medicine

## 2021-08-17 ENCOUNTER — Ambulatory Visit (INDEPENDENT_AMBULATORY_CARE_PROVIDER_SITE_OTHER): Payer: No Typology Code available for payment source | Admitting: Gastroenterology

## 2021-08-17 ENCOUNTER — Other Ambulatory Visit: Payer: Self-pay

## 2021-08-17 ENCOUNTER — Encounter: Payer: Self-pay | Admitting: Gastroenterology

## 2021-08-17 VITALS — BP 155/106 | HR 102 | Temp 99.2°F | Ht 74.0 in | Wt 260.0 lb

## 2021-08-17 DIAGNOSIS — R7989 Other specified abnormal findings of blood chemistry: Secondary | ICD-10-CM

## 2021-08-17 DIAGNOSIS — K625 Hemorrhage of anus and rectum: Secondary | ICD-10-CM

## 2021-08-17 MED ORDER — ATORVASTATIN CALCIUM 40 MG PO TABS: 40.0000 mg | ORAL_TABLET | Freq: Every day | ORAL | 3 refills | Status: DC

## 2021-08-17 MED ORDER — NA SULFATE-K SULFATE-MG SULF 17.5-3.13-1.6 GM/177ML PO SOLN: 354.0000 mL | Freq: Once | ORAL | 0 refills | Status: AC

## 2021-08-17 NOTE — Progress Notes (Signed)
Pt is aware of new medication for cholesterol.

## 2021-08-17 NOTE — Patient Instructions (Addendum)
High-Fiber Eating Plan Fiber, also called dietary fiber, is a type of carbohydrate. It is found foods such as fruits, vegetables, whole grains, and beans. A high-fiber diet can have many health benefits. Your health care provider may recommend a high-fiber diet to help: Prevent constipation. Fiber can make your bowel movements more regular. Lower your cholesterol. Relieve the following conditions: Inflammation of veins in the anus (hemorrhoids). Inflammation of specific areas of the digestive tract (uncomplicated diverticulosis). A problem of the large intestine, also called the colon, that sometimes causes pain and diarrhea (irritable bowel syndrome, or IBS). Prevent overeating as part of a weight-loss plan. Prevent heart disease, type 2 diabetes, and certain cancers. What are tips for following this plan? Reading food labels  Check the nutrition facts label on food products for the amount of dietary fiber. Choose foods that have 5 grams of fiber or more per serving. The goals for recommended daily fiber intake include: Men (age 53 or younger): 34-38 g. Men (over age 16): 28-34 g. Women (age 33 or younger): 25-28 g. Women (over age 53): 22-25 g. Your daily fiber goal is _____________ g. Shopping Choose whole fruits and vegetables instead of processed forms, such as apple juice or applesauce. Choose a wide variety of high-fiber foods such as avocados, lentils, oats, and kidney beans. Read the nutrition facts label of the foods you choose. Be aware of foods with added fiber. These foods often have high sugar and sodium amounts per serving. Cooking Use whole-grain flour for baking and cooking. Cook with brown rice instead of white rice. Meal planning Start the day with a breakfast that is high in fiber, such as a cereal that contains 5 g of fiber or more per serving. Eat breads and cereals that are made with whole-grain flour instead of refined flour or white flour. Eat brown rice,  bulgur wheat, or millet instead of white rice. Use beans in place of meat in soups, salads, and pasta dishes. Be sure that half of the grains you eat each day are whole grains. General information You can get the recommended daily intake of dietary fiber by: Eating a variety of fruits, vegetables, grains, nuts, and beans. Taking a fiber supplement if you are not able to take in enough fiber in your diet. It is better to get fiber through food than from a supplement. Gradually increase how much fiber you consume. If you increase your intake of dietary fiber too quickly, you may have bloating, cramping, or gas. Drink plenty of water to help you digest fiber. Choose high-fiber snacks, such as berries, raw vegetables, nuts, and popcorn. What foods should I eat? Fruits Berries. Pears. Apples. Oranges. Avocado. Prunes and raisins. Dried figs. Vegetables Sweet potatoes. Spinach. Kale. Artichokes. Cabbage. Broccoli. Cauliflower. Green peas. Carrots. Squash. Grains Whole-grain breads. Multigrain cereal. Oats and oatmeal. Brown rice. Barley. Bulgur wheat. [place]. Quinoa. Bran muffins. Popcorn. Rye wafer crackers. Meats and other proteins Navy beans, kidney beans, and pinto beans. Soybeans. Split peas. Lentils. Nuts and seeds. Dairy Fiber-fortified yogurt. Beverages Fiber-fortified soy milk. Fiber-fortified orange juice. Other foods Fiber bars. The items listed above may not be a complete list of recommended foods and beverages. Contact a dietitian for more information. What foods should I avoid? Fruits Fruit juice. Cooked, strained fruit. Vegetables Fried potatoes. Canned vegetables. Well-cooked vegetables. Grains White bread. Pasta made with refined flour. White rice. Meats and other proteins Fatty cuts of meat. Fried chicken or fried fish. Dairy Milk. Yogurt. Cream cheese. Sour cream. Fats and  oils Butters. Beverages Soft drinks. Other foods Cakes and pastries. The items listed  above may not be a complete list of foods and beverages to avoid. Talk with your dietitian about what choices are best for you. Summary Fiber is a type of carbohydrate. It is found in foods such as fruits, vegetables, whole grains, and beans. A high-fiber diet has many benefits. It can help to prevent constipation, lower blood cholesterol, aid weight loss, and reduce your risk of heart disease, diabetes, and certain cancers. Increase your intake of fiber gradually. Increasing fiber too quickly may cause cramping, bloating, and gas. Drink plenty of water while you increase the amount of fiber you consume. The best sources of fiber include whole fruits and vegetables, whole grains, nuts, seeds, and beans. This information is not intended to replace advice given to you by your health care provider. Make sure you discuss any questions you have with your health care provider. Document Revised: 11/06/2019 Document Reviewed: 11/06/2019 Elsevier Patient Education  2022 [place]. Hemorrhoids Hemorrhoids are swollen veins in and around the rectum or anus. There are two types of hemorrhoids: Internal hemorrhoids. These occur in the veins that are just inside the rectum. They may poke through to the outside and become irritated and painful. External hemorrhoids. These occur in the veins that are outside the anus and can be felt as a painful swelling or hard lump near the anus. Most hemorrhoids do not cause serious problems, and they can be managed with home treatments such as diet and lifestyle changes. If home treatments do not help the symptoms, procedures can be done to shrink or remove the hemorrhoids. What are the causes? This condition is caused by increased pressure in the anal area. This pressure may result from various things, including: Constipation. Straining to have a bowel movement. Diarrhea. Pregnancy. Obesity. Sitting for long periods of time. Heavy lifting or other activity that causes  you to strain. Anal sex. Riding a bike for a long period of time. What are the signs or symptoms? Symptoms of this condition include: Pain. Anal itching or irritation. Rectal bleeding. Leakage of stool (feces). Anal swelling. One or more lumps around the anus. How is this diagnosed? This condition can often be diagnosed through a visual exam. Other exams or tests may also be done, such as: An exam that involves feeling the rectal area with a gloved hand (digital rectal exam). An exam of the anal canal that is done using a small tube (anoscope). A blood test, if you have lost a significant amount of blood. A test to look inside the colon using a flexible tube with a camera on the end (sigmoidoscopy or colonoscopy). How is this treated? This condition can usually be treated at home. However, various procedures may be done if dietary changes, lifestyle changes, and other home treatments do not help your symptoms. These procedures can help make the hemorrhoids smaller or remove them completely. Some of these procedures involve surgery, and others do not. Common procedures include: Rubber band ligation. Rubber bands are placed at the base of the hemorrhoids to cut off their blood supply. Sclerotherapy. Medicine is injected into the hemorrhoids to shrink them. Infrared coagulation. A type of light energy is used to get rid of the hemorrhoids. Hemorrhoidectomy surgery. The hemorrhoids are surgically removed, and the veins that supply them are tied off. Stapled hemorrhoidopexy surgery. The surgeon staples the base of the hemorrhoid to the rectal wall. Follow these instructions at home: Eating and drinking  Eat foods that have a lot of fiber in them, such as whole grains, beans, nuts, fruits, and vegetables. Ask your health care provider about taking products that have added fiber (fiber supplements). Reduce the amount of fat in your diet. You can do this by eating low-fat dairy products, eating  less red meat, and avoiding processed foods. Drink enough fluid to keep your urine pale yellow. Managing pain and swelling  Take warm sitz baths for 20 minutes, 3-4 times a day to ease pain and discomfort. You may do this in a bathtub or using a portable sitz bath that fits over the toilet. If directed, apply ice to the affected area. Using ice packs between sitz baths may be helpful. Put ice in a plastic bag. Place a towel between your skin and the bag. Leave the ice on for 20 minutes, 2-3 times a day. General instructions Take over-the-counter and prescription medicines only as told by your health care provider. Use medicated creams or suppositories as told. Get regular exercise. Ask your health care provider how much and what kind of exercise is best for you. In general, you should do moderate exercise for at least 30 minutes on most days of the week (150 minutes each week). This can include activities such as walking, biking, or yoga. Go to the bathroom when you have the urge to have a bowel movement. Do not wait. Avoid straining to have bowel movements. Keep the anal area dry and clean. Use wet toilet paper or moist towelettes after a bowel movement. Do not sit on the toilet for long periods of time. This increases blood pooling and pain. Keep all follow-up visits as told by your health care provider. This is important. Contact a health care provider if you have: Increasing pain and swelling that are not controlled by treatment or medicine. Difficulty having a bowel movement, or you are unable to have a bowel movement. Pain or inflammation outside the area of the hemorrhoids. Get help right away if you have: Uncontrolled bleeding from your rectum. Summary Hemorrhoids are swollen veins in and around the rectum or anus. Most hemorrhoids can be managed with home treatments such as diet and lifestyle changes. Taking warm sitz baths can help ease pain and discomfort. In severe cases,  procedures or surgery can be done to shrink or remove the hemorrhoids. This information is not intended to replace advice given to you by your health care provider. Make sure you discuss any questions you have with your health care provider. Document Revised: 01/12/2021 Document Reviewed: 01/12/2021 Elsevier Patient Education  [place].

## 2021-08-17 NOTE — Progress Notes (Signed)
°  °  Jonathon Bellows MD, MRCP(U.K) [address]  [place]  [place], [place] [address]  Main: [phone]  Fax: [phone]   Gastroenterology Consultation  Referring Provider:     Erven Colla, DO Primary Care Physician:  Coral Spikes, DO Primary Gastroenterologist:  Dr. Jonathon Bellows  Reason for Consultation:    Rectal bleeding         HPI:   Jeffrey Fischer is a 41 y.o. y/o male referred for consultation & management  by Dr. Lacinda Axon, Barnie Del, DO.      Rectal bleeding :  Onset and where was blood seen  : Few months.  Blood mixed with the stool on the toilet bowl on the tissue paper Frequency of bowel movements : Daily Consistency : Soft Change in shape of stool: None Pain associated with bowel movements: Sometimes Blood thinner usage: No NSAID's: No Prior colonoscopy : No Family history of colon cancer or polyps: No, father had colitis Weight loss: No unintentional weight loss    No past medical history on file.  No past surgical history on file.  Prior to Admission medications   Not on File    No family history on file.   Social History   Tobacco Use   Smoking status: Former   Smokeless tobacco: Former    Quit date: 02/09/2014  Substance Use Topics   Alcohol use: Yes    Allergies as of 08/17/2021   (No Known Allergies)    Review of Systems:    All systems reviewed and negative except where noted in HPI.   Physical Exam:  There were no vitals taken for this visit. No LMP for male patient. Psych:  Alert and cooperative. Normal mood and affect. General:   Alert,  Well-developed, well-nourished, pleasant and cooperative in NAD Head:  Normocephalic and atraumatic. Eyes:  Sclera clear, no icterus.   Conjunctiva pink. Ears:  Normal auditory acuity. Lungs:  Respirations even and unlabored.  Clear throughout to auscultation.   No wheezes, crackles, or rhonchi. No acute distress. Heart:  Regular rate and rhythm; no murmurs, clicks, rubs, or  gallops. Abdomen:  Normal bowel sounds.  No bruits.  Soft, non-tender and non-distended without masses, hepatosplenomegaly or hernias noted.  No guarding or rebound tenderness.    Neurologic:  Alert and oriented x3;  grossly normal neurologically. Psych:  Alert and cooperative. Normal mood and affect.  Imaging Studies: No results found.  Assessment and Plan:   Jeffrey Fischer is a 41 y.o. y/o male has been referred for Rectal bleeding.  Based on history possible hemorrhoidal versus colitis  Plan  Colonoscopy  Hemorroids- discussed conservative management , patient information provided- if does not respond and has hemorroids on colonoscopy then will need banding at the office I have discussed alternative options, risks & benefits,  which include, but are not limited to, bleeding, infection, perforation,respiratory complication & drug reaction.  The patient agrees with this plan & written consent will be obtained.    Follow up in 3 to 4 weeks  Dr Jonathon Bellows MD,MRCP(U.K)

## 2021-08-23 ENCOUNTER — Encounter: Payer: Self-pay | Admitting: Gastroenterology

## 2021-08-23 ENCOUNTER — Ambulatory Visit: Payer: No Typology Code available for payment source | Admitting: Registered Nurse

## 2021-08-23 ENCOUNTER — Ambulatory Visit
Admission: RE | Admit: 2021-08-23 | Discharge: 2021-08-23 | Disposition: A | Discharge status: Home / Self Care | Payer: No Typology Code available for payment source | Attending: Gastroenterology | Admitting: Gastroenterology

## 2021-08-23 ENCOUNTER — Encounter
Admission: RE | Disposition: A | Discharge status: Home / Self Care | Payer: Self-pay | Source: Home / Self Care | Attending: Gastroenterology

## 2021-08-23 DIAGNOSIS — Z87891 Personal history of nicotine dependence: Secondary | ICD-10-CM | POA: Diagnosis not present

## 2021-08-23 DIAGNOSIS — K6389 Other specified diseases of intestine: Secondary | ICD-10-CM | POA: Insufficient documentation

## 2021-08-23 DIAGNOSIS — K219 Gastro-esophageal reflux disease without esophagitis: Secondary | ICD-10-CM | POA: Insufficient documentation

## 2021-08-23 DIAGNOSIS — K635 Polyp of colon: Secondary | ICD-10-CM | POA: Diagnosis not present

## 2021-08-23 DIAGNOSIS — K640 First degree hemorrhoids: Secondary | ICD-10-CM | POA: Diagnosis not present

## 2021-08-23 DIAGNOSIS — K625 Hemorrhage of anus and rectum: Secondary | ICD-10-CM | POA: Diagnosis not present

## 2021-08-23 DIAGNOSIS — D123 Benign neoplasm of transverse colon: Secondary | ICD-10-CM | POA: Diagnosis not present

## 2021-08-23 HISTORY — PX: COLONOSCOPY WITH PROPOFOL: SHX5780

## 2021-08-23 SURGERY — COLONOSCOPY WITH PROPOFOL
Anesthesia: General

## 2021-08-23 MED ORDER — PROPOFOL 500 MG/50ML IV EMUL
INTRAVENOUS | Status: DC | PRN
  Administered 2021-08-23: 150 ug/kg/min via INTRAVENOUS

## 2021-08-23 MED ORDER — PROPOFOL 10 MG/ML IV BOLUS
INTRAVENOUS | Status: DC | PRN
  Administered 2021-08-23: 100 mg via INTRAVENOUS
  Administered 2021-08-23: 30 mg via INTRAVENOUS

## 2021-08-23 MED ORDER — DEXMEDETOMIDINE HCL 200 MCG/2ML IV SOLN
INTRAVENOUS | Status: DC | PRN
  Administered 2021-08-23: 20 ug via INTRAVENOUS

## 2021-08-23 MED ORDER — PROPOFOL 500 MG/50ML IV EMUL
INTRAVENOUS | Status: AC
  Filled 2021-08-23: qty 400

## 2021-08-23 MED ORDER — LIDOCAINE HCL (CARDIAC) PF 100 MG/5ML IV SOSY
PREFILLED_SYRINGE | INTRAVENOUS | Status: DC | PRN
  Administered 2021-08-23: 100 mg via INTRAVENOUS

## 2021-08-23 MED ORDER — LIDOCAINE HCL (PF) 2 % IJ SOLN
INTRAMUSCULAR | Status: AC
  Filled 2021-08-23: qty 40

## 2021-08-23 MED ORDER — SODIUM CHLORIDE 0.9 % IV SOLN: INTRAVENOUS | Status: DC

## 2021-08-23 NOTE — Anesthesia Postprocedure Evaluation (Signed)
Anesthesia Post Note  Patient: Jeffrey Fischer  Procedure(s) Performed: COLONOSCOPY WITH PROPOFOL  Patient location during evaluation: Endoscopy Anesthesia Type: General Level of consciousness: awake and alert Pain management: pain level controlled Vital Signs Assessment: post-procedure vital signs reviewed and stable Respiratory status: spontaneous breathing, nonlabored ventilation, respiratory function stable and patient connected to nasal cannula oxygen Cardiovascular status: blood pressure returned to baseline and stable Postop Assessment: no apparent nausea or vomiting Anesthetic complications: no   No notable events documented.   Last Vitals:  Vitals:   08/23/21 0812 08/23/21 0832  BP: 121/90 116/85  Pulse: 96   Resp: 14   Temp: (!) 36.3 C   SpO2: 96%     Last Pain:  Vitals:   08/23/21 0832  TempSrc:   PainSc: 0-No pain                 Precious Haws Kendra Grissett

## 2021-08-23 NOTE — Transfer of Care (Signed)
Immediate Anesthesia Transfer of Care Note  Patient: Jeffrey Fischer  Procedure(s) Performed: COLONOSCOPY WITH PROPOFOL  Patient Location: Endoscopy Unit  Anesthesia Type:General  Level of Consciousness: drowsy  Airway & Oxygen Therapy: Patient Spontanous Breathing and Patient connected to nasal cannula oxygen  Post-op Assessment: Report given to RN and Post -op Vital signs reviewed and stable  Post vital signs: Reviewed and stable  Last Vitals:  Vitals Value Taken Time  BP 121/90 08/23/21 0812  Temp 36.3 C 08/23/21 0812  Pulse 95 08/23/21 0812  Resp 15 08/23/21 0812  SpO2 96 % 08/23/21 0812  Vitals shown include unvalidated device data.  Last Pain:  Vitals:   08/23/21 0812  TempSrc: Temporal  PainSc: Asleep         Complications: No notable events documented.

## 2021-08-23 NOTE — Op Note (Signed)
[facility] Gastroenterology Patient Name: Jeffrey Fischer Procedure Date: 08/23/2021 7:14 AM MRN: [id number] Account #: [id number] Date of Birth: 1981-03-08 Admit Type: Outpatient Age: 41 Room: [facility] ENDO ROOM 2 Gender: Male Note Status: Finalized Instrument Name: Park Meo [id number] Procedure:             Colonoscopy Indications:           Rectal bleeding Providers:             Jonathon Bellows MD, MD Medicines:             Monitored Anesthesia Care Complications:         No immediate complications. Procedure:             Pre-Anesthesia Assessment:                        - Prior to the procedure, a History and Physical was                         performed, and patient medications, allergies and                         sensitivities were reviewed. The patient's tolerance                         of previous anesthesia was reviewed.                        - The risks and benefits of the procedure and the                         sedation options and risks were discussed with the                         patient. All questions were answered and informed                         consent was obtained.                        - ASA Grade Assessment: II - A patient with mild                         systemic disease.                        After obtaining informed consent, the colonoscope was                         passed under direct vision. Throughout the procedure,                         the patient's blood pressure, pulse, and oxygen                         saturations were monitored continuously. The                         Colonoscope was introduced through the anus and  advanced to the the cecum, identified by the                         appendiceal orifice. The colonoscopy was performed                         with ease. The patient tolerated the procedure well.                         The quality of the bowel preparation was excellent. Findings:       The perianal and digital rectal examinations were normal.      Non-bleeding internal hemorrhoids were found during retroflexion. The       hemorrhoids were large and Grade I (internal hemorrhoids that do not       prolapse).      A 6 mm polyp was found in the transverse colon. The polyp was sessile.       The polyp was removed with a cold snare. Resection and retrieval were       complete.      An area of mildly congested mucosa was found in the rectum, in the       sigmoid colon, in the descending colon and in the transverse colon.       Biopsies were taken with a cold forceps for histology.      The exam was otherwise without abnormality on direct and retroflexion       views. Impression:            - Non-bleeding internal hemorrhoids.                        - One 6 mm polyp in the transverse colon, removed with                         a cold snare. Resected and retrieved.                        - Congested mucosa in the rectum, in the sigmoid                         colon, in the descending colon and in the transverse                         colon. Biopsied.                        - The examination was otherwise normal on direct and                         retroflexion views. Recommendation:        - Discharge patient to home (with escort).                        - Resume previous diet.                        - Continue present medications.                        - Await pathology results.                        -  Repeat colonoscopy in 4 years for screening purposes.                        - Return to GI office in 2 weeks. Procedure Code(s):     --- Professional ---                        [phone], Colonoscopy, flexible; with removal of                         tumor(s), polyp(s), or other lesion(s) by snare                         technique                        [id number], 77, Colonoscopy, flexible; with biopsy, single                         or multiple Diagnosis Code(s):     ---  Professional ---                        K63.5, Polyp of colon                        K62.89, Other specified diseases of anus and rectum                        K63.89, Other specified diseases of intestine                        K64.0, First degree hemorrhoids                        K62.5, Hemorrhage of anus and rectum CPT copyright 2019 American Medical Association. All rights reserved. The codes documented in this report are preliminary and upon coder review may  be revised to meet current compliance requirements. Jonathon Bellows, MD Jonathon Bellows MD, MD 08/23/2021 8:10:16 AM This report has been signed electronically. Number of Addenda: 0 Note Initiated On: 08/23/2021 7:14 AM Scope Withdrawal Time: 0 hours 11 minutes 52 seconds  Total Procedure Duration: 0 hours 14 minutes 36 seconds  Estimated Blood Loss:  Estimated blood loss: none.      [facility]

## 2021-08-23 NOTE — Anesthesia Preprocedure Evaluation (Signed)
Anesthesia Evaluation  Patient identified by MRN, date of birth, ID band Patient awake    Reviewed: Allergy & Precautions, NPO status , Patient's Chart, lab work & pertinent test results  History of Anesthesia Complications Negative for: history of anesthetic complications  Airway Mallampati: III  TM Distance: >3 FB Neck ROM: full    Dental  (+) Chipped   Pulmonary neg shortness of breath, former smoker,    Pulmonary exam normal        Cardiovascular Exercise Tolerance: Good (-) angina(-) Past MI and (-) DOE negative cardio ROS Normal cardiovascular exam     Neuro/Psych negative neurological ROS  negative psych ROS   GI/Hepatic negative GI ROS, Neg liver ROS, neg GERD  ,  Endo/Other  negative endocrine ROS  Renal/GU negative Renal ROS  negative genitourinary   Musculoskeletal   Abdominal   Peds  Hematology negative hematology ROS (+)   Anesthesia Other Findings No past medical history on file.  Past Surgical History: No date: NO PAST SURGERIES     Reproductive/Obstetrics negative OB ROS                             Anesthesia Physical Anesthesia Plan  ASA: 2  Anesthesia Plan: General   Post-op Pain Management:    Induction: Intravenous  PONV Risk Score and Plan: Propofol infusion and TIVA  Airway Management Planned: Natural Airway and Nasal Cannula  Additional Equipment:   Intra-op Plan:   Post-operative Plan:   Informed Consent: I have reviewed the patients History and Physical, chart, labs and discussed the procedure including the risks, benefits and alternatives for the proposed anesthesia with the patient or authorized representative who has indicated his/her understanding and acceptance.     Dental Advisory Given  Plan Discussed with: Anesthesiologist, CRNA and Surgeon  Anesthesia Plan Comments: (Patient consented for risks of anesthesia including but not  limited to:  - adverse reactions to medications - risk of airway placement if required - damage to eyes, teeth, lips or other oral mucosa - nerve damage due to positioning  - sore throat or hoarseness - Damage to heart, brain, nerves, lungs, other parts of body or loss of life  Patient voiced understanding.)        Anesthesia Quick Evaluation

## 2021-08-23 NOTE — H&P (Signed)
° ° ° °  Jonathon Bellows, MD [address], [place], [place], [place], [address] [address], [place], [place], [place], [address] Phone: [phone]  Fax: [phone]  Primary Care Physician:  Coral Spikes, DO   Pre-Procedure History & Physical: HPI:  Jeffrey Fischer is a 41 y.o. male is here for an colonoscopy.   No past medical history on file.  Past Surgical History:  Procedure Laterality Date   NO PAST SURGERIES      Prior to Admission medications   Not on File    Allergies as of 08/17/2021   (No Known Allergies)    No family history on file.  Social History   Socioeconomic History   Marital status: Married    Spouse name: Not on file   Number of children: Not on file   Years of education: Not on file   Highest education level: Not on file  Occupational History   Not on file  Tobacco Use   Smoking status: Former   Smokeless tobacco: Former    Quit date: 02/09/2014  Vaping Use   Vaping Use: Never used  Substance and Sexual Activity   Alcohol use: Yes   Drug use: Never   Sexual activity: Not on file  Other Topics Concern   Not on file  Social History Narrative   Not on file   Social Determinants of Health   Financial Resource Strain: Not on file  Food Insecurity: Not on file  Transportation Needs: Not on file  Physical Activity: Not on file  Stress: Not on file  Social Connections: Not on file  Intimate Partner Violence: Not on file    Review of Systems: See HPI, otherwise negative ROS  Physical Exam: BP (!) 159/94    Pulse 89    Temp (!) 97.3 F (36.3 C) (Temporal)    Resp 18    Ht 6\' 2"  (1.88 m)    Wt 113.4 kg    SpO2 100%    BMI 32.10 kg/m  General:   Alert,  pleasant and cooperative in NAD Head:  Normocephalic and atraumatic. Neck:  Supple; no masses or thyromegaly. Lungs:  Clear throughout to auscultation, normal respiratory effort.    Heart:  +S1, +S2, Regular rate and rhythm, No edema. Abdomen:  Soft, nontender and nondistended.  Normal bowel sounds, without guarding, and without rebound.   Neurologic:  Alert and  oriented x4;  grossly normal neurologically.  Impression/Plan: Jeffrey Fischer is here for an colonoscopy to be performed for rectal bleeding,   Risks, benefits, limitations, and alternatives regarding  colonoscopy have been reviewed with the patient.  Questions have been answered.  All parties agreeable.   Jonathon Bellows, MD  08/23/2021, 7:48 AM

## 2021-08-24 ENCOUNTER — Encounter: Payer: Self-pay | Admitting: Gastroenterology

## 2021-08-24 LAB — SURGICAL PATHOLOGY

## 2021-08-25 ENCOUNTER — Encounter: Payer: Self-pay | Admitting: Gastroenterology

## 2021-09-07 ENCOUNTER — Ambulatory Visit: Payer: No Typology Code available for payment source | Admitting: Nurse Practitioner

## 2021-09-12 ENCOUNTER — Other Ambulatory Visit: Payer: Self-pay

## 2021-09-12 ENCOUNTER — Ambulatory Visit (HOSPITAL_COMMUNITY)
Admission: RE | Admit: 2021-09-12 | Discharge: 2021-09-12 | Disposition: A | Discharge status: Home / Self Care | Payer: No Typology Code available for payment source | Source: Ambulatory Visit | Attending: Family Medicine | Admitting: Family Medicine

## 2021-09-12 ENCOUNTER — Ambulatory Visit (INDEPENDENT_AMBULATORY_CARE_PROVIDER_SITE_OTHER): Payer: No Typology Code available for payment source | Admitting: Family Medicine

## 2021-09-12 VITALS — BP 158/92 | HR 96 | Temp 99.1°F | Ht 74.0 in | Wt 256.6 lb

## 2021-09-12 DIAGNOSIS — M25562 Pain in left knee: Secondary | ICD-10-CM | POA: Diagnosis present

## 2021-09-12 DIAGNOSIS — J0191 Acute recurrent sinusitis, unspecified: Secondary | ICD-10-CM

## 2021-09-12 DIAGNOSIS — R7303 Prediabetes: Secondary | ICD-10-CM | POA: Insufficient documentation

## 2021-09-12 DIAGNOSIS — J329 Chronic sinusitis, unspecified: Secondary | ICD-10-CM | POA: Insufficient documentation

## 2021-09-12 DIAGNOSIS — R030 Elevated blood-pressure reading, without diagnosis of hypertension: Secondary | ICD-10-CM | POA: Diagnosis not present

## 2021-09-12 DIAGNOSIS — K648 Other hemorrhoids: Secondary | ICD-10-CM | POA: Diagnosis not present

## 2021-09-12 DIAGNOSIS — E7849 Other hyperlipidemia: Secondary | ICD-10-CM

## 2021-09-12 DIAGNOSIS — R400 Somnolence: Secondary | ICD-10-CM

## 2021-09-12 DIAGNOSIS — R0683 Snoring: Secondary | ICD-10-CM

## 2021-09-12 MED ORDER — REPATHA 140 MG/ML ~~LOC~~ SOSY: 140.0000 mg | PREFILLED_SYRINGE | SUBCUTANEOUS | 11 refills | Status: DC

## 2021-09-12 MED ORDER — DOXYCYCLINE HYCLATE 100 MG PO TABS: 100.0000 mg | ORAL_TABLET | Freq: Two times a day (BID) | ORAL | 0 refills | Status: DC

## 2021-09-12 MED ORDER — PREDNISONE 10 MG PO TABS: ORAL_TABLET | ORAL | 0 refills | Status: DC

## 2021-09-12 NOTE — Assessment & Plan Note (Signed)
Rx sent for Repatha.  We will see if we can get this covered by his insurance.

## 2021-09-12 NOTE — Assessment & Plan Note (Signed)
X-ray was obtained today.  Independently reviewed by me.  Degenerative changes noted.  Effusion noted.  Placing on prednisone.

## 2021-09-12 NOTE — Progress Notes (Signed)
Subjective:  Patient ID: Jeffrey Fischer, male    DOB: 1980-08-14  Age: 41 y.o. MRN: [id number]  CC: Chief Complaint  Patient presents with   swollen left knee    Patient says he is not aware what has happened to left knee.   HPI:  41 year old male presents with multiple issues/complaints.  Left knee pain Patient states that he has had left knee pain and associated swelling over the past 2 weeks. No fall, trauma, injury.  Swelling has improved but is still present.  The pain is worse with activity. No relieving factors.  Follow-up regarding colonoscopy I have reviewed his colonoscopy report.  Patient states that he does not have scheduled follow-up regarding banding for hemorrhoids.  He would like me to inquire about follow-up.  STD testing Patient states that he and his wife separated for period of time and he was with another partner.  He states that he is not having any symptoms but is concerned about the possibility of STD. Patient is requesting antibiotics. He states that his wife has access to his MyChart account and therefore he does not want testing through our health system.  Sinusitis Symptoms over the past several days.  Reports congestion and altered taste in his mouth.  He states that he has a history of recurrent sinusitis and believes that he is having a recurrence.  No fever.  No relieving factors.  Hyperlipidemia Has not tolerated Crestor or Lipitor.  Has markedly elevated LDL.  Suspect familial hyperlipidemia.  Would like to try injectable.  Daytime sleepiness/fatigue Patient states that he snores.  After sleeping greater than 8 hours, he still wakes fatigued.  Patient Active Problem List   Diagnosis Date Noted   Internal hemorrhoids 09/12/2021   Prediabetes 09/12/2021   Elevated BP without diagnosis of hypertension 09/12/2021   Acute pain of left knee 09/12/2021   Sinusitis 09/12/2021   Snoring 09/12/2021   Hyperlipidemia 09/26/2016    Social Hx    Social History   Socioeconomic History   Marital status: Married    Spouse name: Not on file   Number of children: Not on file   Years of education: Not on file   Highest education level: Not on file  Occupational History   Not on file  Tobacco Use   Smoking status: Former   Smokeless tobacco: Former    Quit date: 02/09/2014  Vaping Use   Vaping Use: Never used  Substance and Sexual Activity   Alcohol use: Yes   Drug use: Never   Sexual activity: Not on file  Other Topics Concern   Not on file  Social History Narrative   Not on file   Social Determinants of Health   Financial Resource Strain: Not on file  Food Insecurity: Not on file  Transportation Needs: Not on file  Physical Activity: Not on file  Stress: Not on file  Social Connections: Not on file    Review of Systems Per HPI  Objective:  BP (!) 158/92    Pulse 96    Temp 99.1 F (37.3 C) (Oral)    Ht 6\' 2"  (1.88 m)    Wt 256 lb 9.6 oz (116.4 kg)    SpO2 98%    BMI 32.95 kg/m   BP/Weight 09/12/2021 4/0/9811 03/17/4781  Systolic BP 956 213 086  Diastolic BP 92 85 578  Wt. (Lbs) 256.6 250 260  BMI 32.95 32.1 33.38    Physical Exam Constitutional:      Appearance:  Normal appearance. He is obese.  HENT:     Head: Normocephalic and atraumatic.  Cardiovascular:     Rate and Rhythm: Normal rate and regular rhythm.  Pulmonary:     Effort: Pulmonary effort is normal.     Breath sounds: Normal breath sounds. No wheezing, rhonchi or rales.  Musculoskeletal:     Comments: Left knee -nontender to palpation.  Joint effusion noted.  Neurological:     Mental Status: He is alert.    Lab Results  Component Value Date   WBC 4.5 08/15/2021   HGB 15.8 08/15/2021   HCT 45.6 08/15/2021   PLT 181 08/15/2021   GLUCOSE 121 (H) 08/15/2021   CHOL 291 (H) 08/15/2021   TRIG 237 (H) 08/15/2021   HDL 30 (L) 08/15/2021   LDLCALC 214 (H) 08/15/2021   ALT 64 (H) 08/15/2021   AST 46 (H) 08/15/2021   NA 142 08/15/2021    K 4.3 08/15/2021   CL 103 08/15/2021   CREATININE 1.03 08/15/2021   BUN 10 08/15/2021   CO2 23 08/15/2021   HGBA1C 6.2 (H) 08/15/2021     Assessment & Plan:   Problem List Items Addressed This Visit       Cardiovascular and Mediastinum   Internal hemorrhoids    Spoke with GI.  He has follow-up for banding on 3/1.      Relevant Medications   atorvastatin (LIPITOR) 40 MG tablet   Evolocumab (REPATHA) 140 MG/ML SOSY     Respiratory   Sinusitis    Treating with doxycycline.      Relevant Medications   doxycycline (VIBRA-TABS) 100 MG tablet   predniSONE (DELTASONE) 10 MG tablet     Other   Hyperlipidemia    Rx sent for Repatha.  We will see if we can get this covered by his insurance.      Relevant Medications   atorvastatin (LIPITOR) 40 MG tablet   Evolocumab (REPATHA) 140 MG/ML SOSY   Elevated BP without diagnosis of hypertension   Acute pain of left knee - Primary    X-ray was obtained today.  Independently reviewed by me.  Degenerative changes noted.  Effusion noted.  Placing on prednisone.      Relevant Orders   DG Knee Complete 4 Views Left (Completed)   Snoring    Concern for sleep apnea.  Arranging sleep study.      Relevant Orders   Ambulatory referral to Sleep Studies   Other Visit Diagnoses     Daytime sleepiness       Relevant Orders   Ambulatory referral to Sleep Studies      Regarding STD testing, I advised the patient to get testing from the health department given his concerns that his wife could see his chart.  This note will be made sensitive so that it is not released in his chart due to privacy concerns from the patient.  Meds ordered this encounter  Medications   Evolocumab (REPATHA) 140 MG/ML SOSY    Sig: Inject 140 mg into the skin every 14 (fourteen) days.    Dispense:  2 mL    Refill:  11   doxycycline (VIBRA-TABS) 100 MG tablet    Sig: Take 1 tablet (100 mg total) by mouth 2 (two) times daily.    Dispense:  14 tablet     Refill:  0   predniSONE (DELTASONE) 10 MG tablet    Sig: 50 mg daily x 2 days, then 40 mg daily x 2 days,  then 30 mg daily x 2 days, then 20 mg daily x 2 days, then 10 mg daily x 2 days.    Dispense:  30 tablet    Refill:  [place]

## 2021-09-12 NOTE — Patient Instructions (Addendum)
Testing at the health department if you like.  Antibiotic as prescribed.  I have reached out to GI.  Xray today.  I will order sleep study.  Check BP's at home.  Take care  Dr. Lacinda Axon

## 2021-09-12 NOTE — Assessment & Plan Note (Signed)
Treating with doxycycline. 

## 2021-09-12 NOTE — Assessment & Plan Note (Signed)
Spoke with GI.  He has follow-up for banding on 3/1.

## 2021-09-12 NOTE — Assessment & Plan Note (Signed)
Concern for sleep apnea.  Arranging sleep study.

## 2021-09-14 ENCOUNTER — Other Ambulatory Visit: Payer: Self-pay

## 2021-09-14 ENCOUNTER — Ambulatory Visit (INDEPENDENT_AMBULATORY_CARE_PROVIDER_SITE_OTHER): Payer: No Typology Code available for payment source | Admitting: Gastroenterology

## 2021-09-14 ENCOUNTER — Encounter: Payer: Self-pay | Admitting: Gastroenterology

## 2021-09-14 VITALS — BP 136/75 | HR 123 | Temp 99.1°F | Wt 255.0 lb

## 2021-09-14 DIAGNOSIS — K648 Other hemorrhoids: Secondary | ICD-10-CM | POA: Diagnosis not present

## 2021-09-14 NOTE — Progress Notes (Signed)
Patient follow-ups today for banding of hemorrhoids ? ? ? ?Summary of history : ? ?Initially referred and seen on 08/17/2021 for rectal bleeding and I performed a colonoscopy on 08/23/2021 that showed nonbleeding large internal hemorrhoids and a 6 mm polyp in the transverse colon that was biopsied.The polyp was a tubular adenoma.  Biopsies of the left colon showed no active inflammation rectosigmoid biopsy showed mild active inflammation but no features of chronicity. ? ? ? ? ?Interval history   08/17/2021-09/14/2021 ?After the colonoscopy despite conservative management continues to have on and off rectal bleeding discussed about hemorrhoidal banding he wishes to go ahead ? ?Digital rectal exam performed in the presence of a chaperone. ?External anal findings: Skin tag  ?Internal findings: , No masses, no blood on glove noticed. ? ? ? ?PROCEDURE NOTE: ?The patient presents with symptomatic grade 1 hemorrhoids, unresponsive to maximal medical therapy, requesting rubber band ligation of his/her hemorrhoidal disease.  All risks, benefits and alternative forms of therapy were described and informed consent was obtained. ? ?In the Left Lateral Decubitus position (if anoscopy is performed) anoscopic examination revealed grade 1 hemorrhoids in the all position(s).  ? ?The decision was made to band the LL internal hemorrhoid, and the [place] O?Regan System was used to perform band ligation without complication.  Digital anorectal examination was then performed to assure proper positioning of the band, and to adjust the banded tissue as required.  The patient was discharged home without pain or other issues.  Dietary and behavioral recommendations were given and (if necessary - prescriptions were given), along with follow-up instructions.  The patient will return 4 weeks for follow-up and possible additional banding as required. ? ?No complications were encountered and the patient tolerated the procedure well. ? ? ?Plan: ? ?Avoid  constipation.  Commence on stool softeners if not already on ? ?Follow-up: 4 weeks ? ?Dr Jonathon Bellows MD,MRCP [facility]) ?Gastroenterology/Hepatology ?Pager: [phone] ?  ?

## 2021-09-14 NOTE — Progress Notes (Signed)
x

## 2021-10-12 ENCOUNTER — Encounter: Payer: Self-pay | Admitting: Gastroenterology

## 2021-10-12 ENCOUNTER — Ambulatory Visit (INDEPENDENT_AMBULATORY_CARE_PROVIDER_SITE_OTHER): Payer: No Typology Code available for payment source | Admitting: Gastroenterology

## 2021-10-12 ENCOUNTER — Other Ambulatory Visit: Payer: Self-pay

## 2021-10-12 VITALS — BP 136/91 | HR 99 | Temp 98.7°F | Wt 255.0 lb

## 2021-10-12 DIAGNOSIS — K648 Other hemorrhoids: Secondary | ICD-10-CM | POA: Diagnosis not present

## 2021-10-12 NOTE — Progress Notes (Signed)
Patient follow-ups today for banding of hemorrhoids ? ? ? ?Summary of history : ? ?Initially referred and seen on 08/17/2021 for rectal bleeding and I performed a colonoscopy on 08/23/2021 that showed nonbleeding large internal hemorrhoids and a 6 mm polyp in the transverse colon that was biopsied.The polyp was a tubular adenoma.  Biopsies of the left colon showed no active inflammation rectosigmoid biopsy showed mild active inflammation but no features of chronicity. ? ? ? ?First round: 09/14/2021: Left lateral column was banded  ? ? ?Interval history 09/14/2021-10/12/2021 ? ? ? ?Digital rectal exam performed in the presence of a chaperone. ?External anal findings: norMAL ?Internal findings: , No masses, no blood on glove noticed. ? ? ? ?PROCEDURE NOTE: ?The patient presents with symptomatic grade 1 hemorrhoids, unresponsive to maximal medical therapy, requesting rubber band ligation of his/her hemorrhoidal disease.  All risks, benefits and alternative forms of therapy were described and informed consent was obtained. ? ?In the Left Lateral Decubitus position (if anoscopy is performed) anoscopic examination revealed grade 1 hemorrhoids in the RA and RP position(s).  ? ?The decision was made to band the RA internal hemorrhoid, and the [place] O?Regan System was used to perform band ligation without complication.  Digital anorectal examination was then performed to assure proper positioning of the band, and to adjust the banded tissue as required.  The patient was discharged home without pain or other issues.  Dietary and behavioral recommendations were given and (if necessary - prescriptions were given), along with follow-up instructions.  The patient will return 4 weeks for follow-up and possible additional banding as required. ? ?No complications were encountered and the patient tolerated the procedure well. ? ? ?Plan: ? ?Avoid constipation.  Commence on stool softeners if not already on ? ?Follow-up: 4 weeks ? ?Dr Jonathon Bellows  MD,MRCP [facility]) ?Gastroenterology/Hepatology ?Pager: [phone] ?  ?

## 2021-10-18 ENCOUNTER — Ambulatory Visit: Payer: No Typology Code available for payment source | Admitting: Gastroenterology

## 2021-10-19 ENCOUNTER — Ambulatory Visit (HOSPITAL_COMMUNITY)
Admission: EM | Admit: 2021-10-19 | Discharge: 2021-10-19 | Disposition: A | Discharge status: Home / Self Care | Payer: No Typology Code available for payment source

## 2021-10-19 ENCOUNTER — Ambulatory Visit (INDEPENDENT_AMBULATORY_CARE_PROVIDER_SITE_OTHER): Payer: No Typology Code available for payment source

## 2021-10-19 ENCOUNTER — Ambulatory Visit
Admission: EM | Admit: 2021-10-19 | Discharge: 2021-10-19 | Disposition: A | Discharge status: Home / Self Care | Payer: No Typology Code available for payment source | Attending: Urgent Care | Admitting: Urgent Care

## 2021-10-19 DIAGNOSIS — M25471 Effusion, right ankle: Secondary | ICD-10-CM

## 2021-10-19 DIAGNOSIS — S96911A Strain of unspecified muscle and tendon at ankle and foot level, right foot, initial encounter: Secondary | ICD-10-CM

## 2021-10-19 DIAGNOSIS — M25571 Pain in right ankle and joints of right foot: Secondary | ICD-10-CM | POA: Diagnosis not present

## 2021-10-19 MED ORDER — NAPROXEN 375 MG PO TABS
375.0000 mg | ORAL_TABLET | Freq: Two times a day (BID) | ORAL | 0 refills | Status: DC
  Filled 2021-12-28: qty 30, 15d supply, fill #0

## 2021-10-19 NOTE — ED Provider Notes (Signed)
?[place] ? ? ?MRN: [id number] DOB: 27-Jul-1980 ? ?Subjective:  ? ?Jeffrey Fischer is a 41 y.o. male presenting for 3 day history of persistent left ankle pain and swelling. Symptoms were improving yesterday so he was more active and this worsened his pain again.  Denies any particular fall, trauma, rolling of his ankle.  He has been icing and wrapping.  His wife wanted to make sure that he did not have a fracture. ? ?No current facility-administered medications for this encounter. ?No current outpatient medications on file.  ? ?No Known Allergies ? ?History reviewed. No pertinent past medical history.  ? ?Past Surgical History:  ?Procedure Laterality Date  ? COLONOSCOPY WITH PROPOFOL N/A 08/23/2021  ? Procedure: COLONOSCOPY WITH PROPOFOL;  Surgeon: Jonathon Bellows, MD;  Location: [facility] ENDOSCOPY;  Service: Gastroenterology;  Laterality: N/A;  ? NO PAST SURGERIES    ? ? ?History reviewed. No pertinent family history. ? ?Social History  ? ?Tobacco Use  ? Smoking status: Former  ? Smokeless tobacco: Former  ?  Quit date: 02/09/2014  ?Vaping Use  ? Vaping Use: Never used  ?Substance Use Topics  ? Alcohol use: Yes  ? Drug use: Never  ? ? ?ROS ? ? ?Objective:  ? ?Vitals: ?BP (!) 144/88 (BP Location: Right Arm)   Pulse (!) 111   Temp 99 ?F (37.2 ?C) (Oral)   Resp 20   SpO2 96%  ? ?Physical Exam ?Constitutional:   ?   General: He is not in acute distress. ?   Appearance: Normal appearance. He is well-developed and normal weight. He is not ill-appearing, toxic-appearing or diaphoretic.  ?HENT:  ?   Head: Normocephalic and atraumatic.  ?   Right Ear: External ear normal.  ?   Left Ear: External ear normal.  ?   Nose: Nose normal.  ?   Mouth/Throat:  ?   Pharynx: Oropharynx is clear.  ?Eyes:  ?   General: No scleral icterus.    ?   Right eye: No discharge.     ?   Left eye: No discharge.  ?   Extraocular Movements: Extraocular movements intact.  ?Cardiovascular:  ?   Rate and Rhythm: Normal rate.  ?Pulmonary:  ?    Effort: Pulmonary effort is normal.  ?Musculoskeletal:  ?   Cervical back: Normal range of motion.  ?   Right ankle: Swelling present. No deformity, ecchymosis or lacerations. Tenderness present over the lateral malleolus, ATF ligament, CF ligament and posterior TF ligament. No medial malleolus, AITF ligament, base of 5th metatarsal or proximal fibula tenderness. Normal range of motion.  ?   Right Achilles Tendon: No tenderness or defects. Thompson's test negative.  ?Neurological:  ?   Mental Status: He is alert and oriented to person, place, and time.  ?Psychiatric:     ?   Mood and Affect: Mood normal.     ?   Behavior: Behavior normal.     ?   Thought Content: Thought content normal.     ?   Judgment: Judgment normal.  ? ? ?DG Ankle Complete Right ? ?Result Date: 10/19/2021 ?CLINICAL DATA:  Pain and swelling RIGHT ankle after jumping 3 days ago EXAM: RIGHT ANKLE - COMPLETE 3+ VIEW COMPARISON:  12/06/2005 FINDINGS: Diffuse soft tissue swelling. Osseous mineralization normal. Joint spaces preserved. No acute fracture, dislocation, or bone destruction. IMPRESSION: Soft tissue swelling without acute osseous abnormalities. Electronically Signed   By: Lavonia Dana M.D.   On: 10/19/2021 17:05   ? ? ?  Assessment and Plan :  ? ?PDMP not reviewed this encounter. ? ?1. Ankle strain, right, initial encounter   ?2. Pain and swelling of ankle, right   ? ?Patient declined me wrapping his ankle but wanted the Ace wrap.  Recommended general RICE method for an ankle strain.  Use naproxen as needed for pain and inflammation. Counseled patient on potential for adverse effects with medications prescribed/recommended today, ER and return-to-clinic precautions discussed, patient verbalized understanding. ? ?  ?Jaynee Eagles, PA-C ?10/19/21 1741 ? ?

## 2021-10-19 NOTE — ED Notes (Signed)
Left without being seen.

## 2021-10-19 NOTE — ED Triage Notes (Signed)
Pt reports pin and swelling in right ankle after he jumper 3 days ago.  ?

## 2021-10-25 ENCOUNTER — Ambulatory Visit: Payer: No Typology Code available for payment source | Admitting: Gastroenterology

## 2021-11-14 ENCOUNTER — Ambulatory Visit: Payer: No Typology Code available for payment source | Admitting: Neurology

## 2021-11-14 ENCOUNTER — Encounter: Payer: Self-pay | Admitting: Neurology

## 2021-11-14 VITALS — BP 144/90 | HR 102 | Ht 74.0 in | Wt 256.0 lb

## 2021-11-14 DIAGNOSIS — G2581 Restless legs syndrome: Secondary | ICD-10-CM

## 2021-11-14 DIAGNOSIS — R0683 Snoring: Secondary | ICD-10-CM | POA: Diagnosis not present

## 2021-11-14 DIAGNOSIS — E669 Obesity, unspecified: Secondary | ICD-10-CM

## 2021-11-14 DIAGNOSIS — G478 Other sleep disorders: Secondary | ICD-10-CM

## 2021-11-14 DIAGNOSIS — R030 Elevated blood-pressure reading, without diagnosis of hypertension: Secondary | ICD-10-CM | POA: Diagnosis not present

## 2021-11-14 DIAGNOSIS — G4719 Other hypersomnia: Secondary | ICD-10-CM

## 2021-11-14 DIAGNOSIS — R0689 Other abnormalities of breathing: Secondary | ICD-10-CM

## 2021-11-14 NOTE — Progress Notes (Signed)
Subjective:  ?  ?Patient ID: Jeffrey Fischer is a 41 y.o. male. ? ?HPI ? ? ? ?Star Age, MD, PhD ?[facility] ?[address] ?[id number] ?[place], [place] [address] ? ?Dear Dr. Lacinda Axon,  ? ?I saw your patient, Jeffrey Fischer, upon your kind request in my Sleep clinic today for consultation of his sleep disorder, in particular, concern for underlying obstructive sleep apnea.  The patient is unaccompanied today.  As you know, Jeffrey Fischer is a 41 year old right-handed gentleman with an underlying medical history of hyperlipidemia, elevated blood pressure, knee pain and mild obesity, who reports snoring and excessive daytime somnolence, as well as nonrestorative sleep.  I reviewed your office note from 09/12/2021.  His Epworth sleepiness score is 2 out of 24, fatigue severity score is 16 out of 63.  He lives with his wife and 3 children, they have 1 dog in the household.  He does not drink caffeine daily but drinks alcohol nearly daily, about a sixpack of beer per day.  He quit smoking in January 2023.  He does report that alcohol in the evening helps him fall asleep but he does not rested typically.  He denies recurrent morning headaches.  He denies night to night nocturia and does not have a family history of sleep apnea as far as he knows.  He has had a fairly stable weight.  He goes to bed around 10 or 11 PM and rise time is between 7 and 7:30 AM.  He has occasional restless leg symptoms but does not know if he twitches or kicks in his sleep.  He has never had a sleep study.   ? ?His Past Medical History Is Significant For: ?History reviewed. No pertinent past medical history. ? ?His Past Surgical History Is Significant For: ?Past Surgical History:  ?Procedure Laterality Date  ? COLONOSCOPY WITH PROPOFOL N/A 08/23/2021  ? Procedure: COLONOSCOPY WITH PROPOFOL;  Surgeon: Jonathon Bellows, MD;  Location: [facility] ENDOSCOPY;  Service: Gastroenterology;  Laterality: N/A;  ? NO PAST SURGERIES    ? ? ?His  Family History Is Significant For: ?Family History  ?Problem Relation Age of Onset  ? Sleep apnea Neg Hx   ? ? ?His Social History Is Significant For: ?Social History  ? ?Socioeconomic History  ? Marital status: Married  ?  Spouse name: Not on file  ? Number of children: Not on file  ? Years of education: Not on file  ? Highest education level: Not on file  ?Occupational History  ? Not on file  ?Tobacco Use  ? Smoking status: Former  ? Smokeless tobacco: Former  ?  Quit date: 02/09/2014  ?Vaping Use  ? Vaping Use: Never used  ?Substance and Sexual Activity  ? Alcohol use: Yes  ?  Alcohol/week: 30.0 standard drinks  ?  Types: 15 Cans of beer, 15 Shots of liquor per week  ? Drug use: Never  ? Sexual activity: Yes  ?Other Topics Concern  ? Not on file  ?Social History Narrative  ? Not on file  ? ?Social Determinants of Health  ? ?Financial Resource Strain: Not on file  ?Food Insecurity: Not on file  ?Transportation Needs: Not on file  ?Physical Activity: Not on file  ?Stress: Not on file  ?Social Connections: Not on file  ? ? ?His Allergies Are:  ?No Known Allergies:  ? ?His Current Medications Are:  ?Outpatient Encounter Medications as of 11/14/2021  ?Medication Sig  ? naproxen (NAPROSYN) 375 MG tablet  Take 1 tablet (375 mg total) by mouth 2 (two) times daily with a meal.  ? ?No facility-administered encounter medications on file as of 11/14/2021.  ?: ? ? ?Review of Systems:  ?Out of a complete 14 point review of systems, all are reviewed and negative with the exception of these symptoms as listed below: ? ?Review of Systems  ?Neurological:   ?     Pt is here for sleep consult . Pt states he snores and some fatigue.Pt declines headaches and hypertension  CPAP machine ,and sleep study  ? ?ESS:16 ?FSS:2 ? ?  ? ?Objective:  ?Neurological Exam ? ?Physical Exam ?Physical Examination:  ? ?Vitals:  ? 11/14/21 0957  ?BP: (!) 144/90  ?Pulse: (!) 102  ? ? ?General Examination: The patient is a very pleasant 41 y.o. male in no acute  distress. He appears well-developed and well-nourished and well groomed.  ? ?HEENT: Normocephalic, atraumatic, pupils are equal, round and reactive to light, extraocular tracking is good without limitation to gaze excursion or nystagmus noted. Hearing is grossly intact. Face is symmetric with normal facial animation. Speech is clear with no dysarthria noted. There is no hypophonia. There is no lip, neck/head, jaw or voice tremor. Neck is supple with full range of passive and active motion. There are no carotid bruits on auscultation. Oropharynx exam reveals: mild mouth dryness, good dental hygiene and moderate airway crowding, due to slightly wider uvula which is also elongated with a thin wisp like ending.  Tonsils about 2-3+ bilaterally.  Mallampati class III.  Neck circumference of 18 3/8 inches.  Tongue protrudes centrally and palate elevates symmetrically.  Slightly thicker tongue noted.  Nasal inspection reveals significant deviated septum to the right.  He does admit to having had nasal injuries in the past as he participated in finding sports. ? ?Chest: Clear to auscultation without wheezing, rhonchi or crackles noted. ? ?Heart: S1+S2+0, regular and normal without murmurs, rubs or gallops noted.  ? ?Abdomen: Soft, non-tender and non-distended. ? ?Extremities: There is no pitting edema in the distal lower extremities bilaterally.  ? ?Skin: Warm and dry without trophic changes noted.  ? ?Musculoskeletal: exam reveals swelling to right ankle, he reports that he recently jumped and injured his right ankle, he had it x-rayed, no fractures were found.  ? ?Neurologically:  ?Mental status: The patient is awake, alert and oriented in all 4 spheres. His immediate and remote memory, attention, language skills and fund of knowledge are appropriate. There is no evidence of aphasia, agnosia, apraxia or anomia. Speech is clear with normal prosody and enunciation. Thought process is linear. Mood is normal and affect is  normal.  ?Cranial nerves II - XII are as described above under HEENT exam.  ?Motor exam: Normal bulk, strength and tone is noted. There is no obvious tremor.  ?Fine motor skills and coordination: grossly intact.  ?Cerebellar testing: No dysmetria or intention tremor. There is no truncal or gait ataxia.  ?Sensory exam: intact to light touch in the upper and lower extremities.  ?Gait, station and balance: He stands easily. No veering to one side is noted. No leaning to one side is noted. Posture is age-appropriate and stance is narrow based. Gait shows normal stride length and normal pace. No problems turning are noted.  ? ?Assessment and Plan:  ?In summary, Jeffrey Fischer is a very pleasant 41 y.o.-year old male with an underlying medical history of hyperlipidemia, elevated blood pressure, knee pain and mild obesity, whose history and physical exam  are concerning for obstructive sleep apnea (OSA). ?I had a long chat with the patient about my findings and the diagnosis of OSA, its prognosis and treatment options. We talked about medical treatments, surgical interventions and non-pharmacological approaches. I explained in particular the risks and ramifications of untreated moderate to severe OSA, especially with respect to developing cardiovascular disease down the Road, including congestive heart failure, difficult to treat hypertension, cardiac arrhythmias, or stroke. Even type 2 diabetes has, in part, been linked to untreated OSA. Symptoms of untreated OSA include daytime sleepiness, memory problems, mood irritability and mood disorder such as depression and anxiety, lack of energy, as well as recurrent headaches, especially morning headaches. We talked about trying to maintain a healthy lifestyle in general, as well as the importance of weight control.  He was encouraged to scale back on his daily alcohol consumption.   ?I recommended the following at this time: sleep study.  I outlined the differences between a  laboratory attended sleep study versus home sleep testing. ?I explained the sleep test procedure to the patient and also outlined possible surgical and non-surgical treatment options of OSA, including the use of

## 2021-11-14 NOTE — Patient Instructions (Signed)

## 2021-11-15 ENCOUNTER — Ambulatory Visit (INDEPENDENT_AMBULATORY_CARE_PROVIDER_SITE_OTHER): Payer: No Typology Code available for payment source | Admitting: Gastroenterology

## 2021-11-15 ENCOUNTER — Encounter: Payer: Self-pay | Admitting: Gastroenterology

## 2021-11-15 VITALS — BP 149/96 | HR 105 | Temp 98.7°F | Wt 255.0 lb

## 2021-11-15 DIAGNOSIS — K648 Other hemorrhoids: Secondary | ICD-10-CM

## 2021-11-15 NOTE — Progress Notes (Signed)
Patient follow-ups today for banding of hemorrhoids ? ? ? ?Summary of history : ? ?Initially referred and seen on 08/17/2021 for rectal bleeding and I performed a colonoscopy on 08/23/2021 that showed nonbleeding large internal hemorrhoids and a 6 mm polyp in the transverse colon that was biopsied.The polyp was a tubular adenoma.  Biopsies of the left colon showed no active inflammation rectosigmoid biopsy showed mild active inflammation but no features of chronicity. ?  ?  ?  ?First round: 09/14/2021: Left lateral column was banded  ?Second round: RA column banded   ?  ?Interval history 10/12/2021-11/15/2021 ?  ?No bleeding ?Digital rectal exam performed in the presence of a chaperone. ?External anal findings:  ?Internal findings: , No masses, no blood on glove noticed. ? ? ? ?PROCEDURE NOTE: ?The patient presents with symptomatic grade 1 hemorrhoids, unresponsive to maximal medical therapy, requesting rubber band ligation of his/her hemorrhoidal disease.  All risks, benefits and alternative forms of therapy were described and informed consent was obtained. ? ?In the Left Lateral Decubitus position (if anoscopy is performed) anoscopic examination revealed grade 1 hemorrhoids in the LL position(s).  ? ?The decision was made to band the LL internal hemorrhoid, and the [place] O?Regan System was used to perform band ligation without complication.  Digital anorectal examination was then performed to assure proper positioning of the band, and to adjust the banded tissue as required.  The patient was discharged home without pain or other issues.  Dietary and behavioral recommendations were given and (if necessary - prescriptions were given), along with follow-up instructions.  The patient will return as needed for follow-up and possible additional banding as required. ? ?No complications were encountered and the patient tolerated the procedure well. ? ? ?Plan: ? ?Avoid constipation.  Commence on stool softeners if not already  on ? ?Follow-up: As needed ? ?Dr Jonathon Bellows MD,MRCP [facility]) ?Gastroenterology/Hepatology ?Pager: [phone] ?  ?

## 2021-11-15 NOTE — Patient Instructions (Signed)
If you have any pain please come back tomorrow after 1 PM. If not, you take care. ?

## 2021-12-27 ENCOUNTER — Other Ambulatory Visit: Payer: Self-pay

## 2021-12-27 MED ORDER — REPATHA SURECLICK 140 MG/ML ~~LOC~~ SOAJ
SUBCUTANEOUS | 11 refills | Status: DC
  Filled 2021-12-27 – 2022-01-03 (×2): qty 2, 28d supply, fill #0

## 2021-12-28 ENCOUNTER — Other Ambulatory Visit: Payer: Self-pay

## 2021-12-28 MED ORDER — MUPIROCIN 2 % EX OINT
TOPICAL_OINTMENT | Freq: Two times a day (BID) | CUTANEOUS | 2 refills | Status: DC
  Filled 2021-12-28: qty 22, 11d supply, fill #0

## 2021-12-28 MED ORDER — ATORVASTATIN CALCIUM 40 MG PO TABS
ORAL_TABLET | Freq: Every day | ORAL | 3 refills | Status: DC
  Filled 2021-12-28: qty 90, 90d supply, fill #0

## 2021-12-29 ENCOUNTER — Telehealth: Payer: Self-pay | Admitting: Neurology

## 2021-12-29 NOTE — Telephone Encounter (Signed)
unable to leave vmail the mail box is full  [facility] no auth req spoke to [place] ref # [id number]

## 2022-01-03 ENCOUNTER — Other Ambulatory Visit: Payer: Self-pay

## 2022-01-04 ENCOUNTER — Other Ambulatory Visit: Payer: Self-pay

## 2022-01-04 NOTE — Telephone Encounter (Signed)
spoke to the pt, he states he will call back to schedule

## 2022-08-29 IMAGING — DX DG ANKLE COMPLETE 3+V*R*
3 series · 3 of 3 positions shown · non-contrast
Comparison: 12/06/2005

CLINICAL DATA: Pain and swelling RIGHT ankle after jumping 3 days
ago

EXAM:
RIGHT ANKLE - COMPLETE 3+ VIEW

[ankle ap]
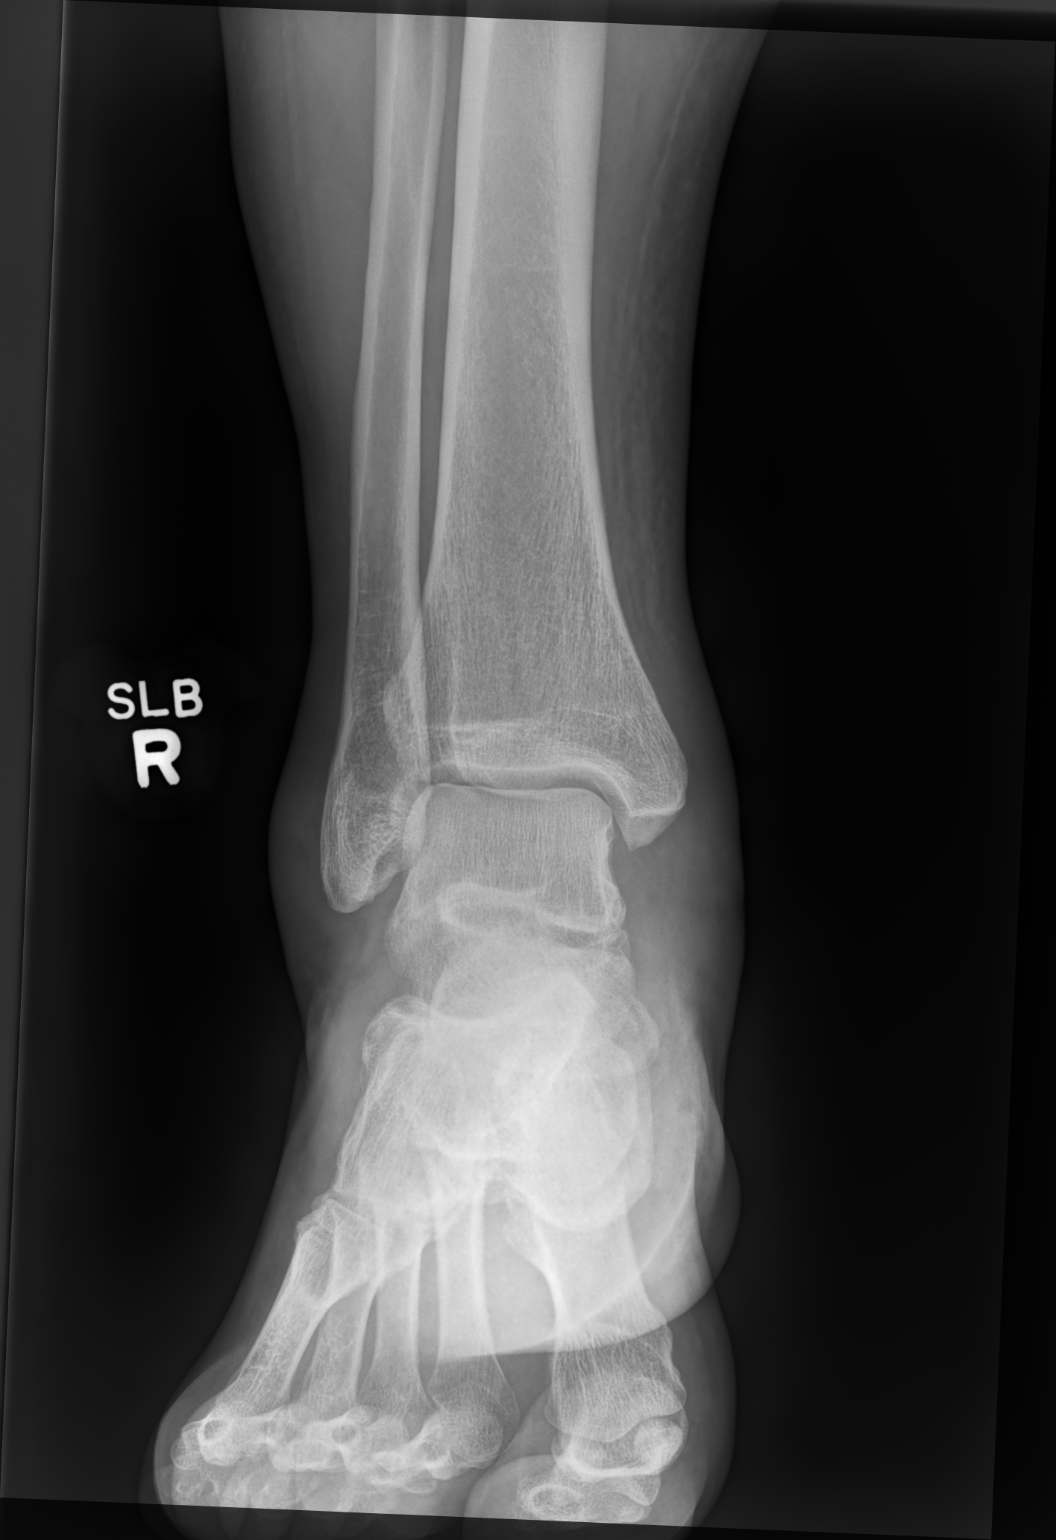

[ankle mlo]
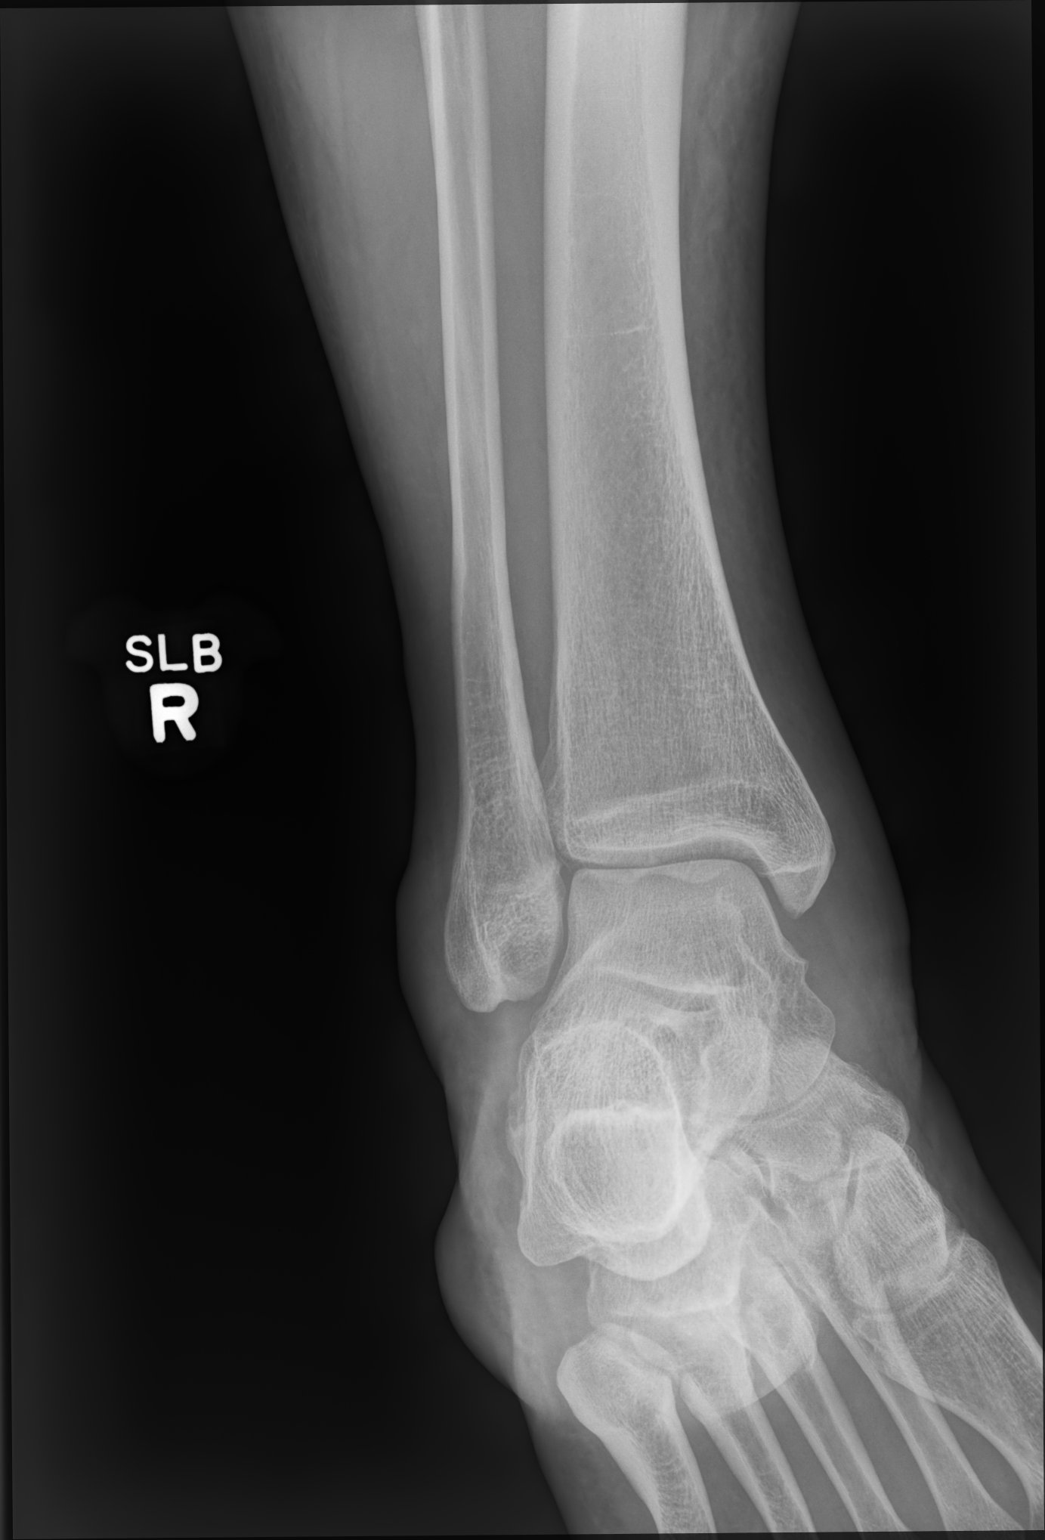

[ankle lat]
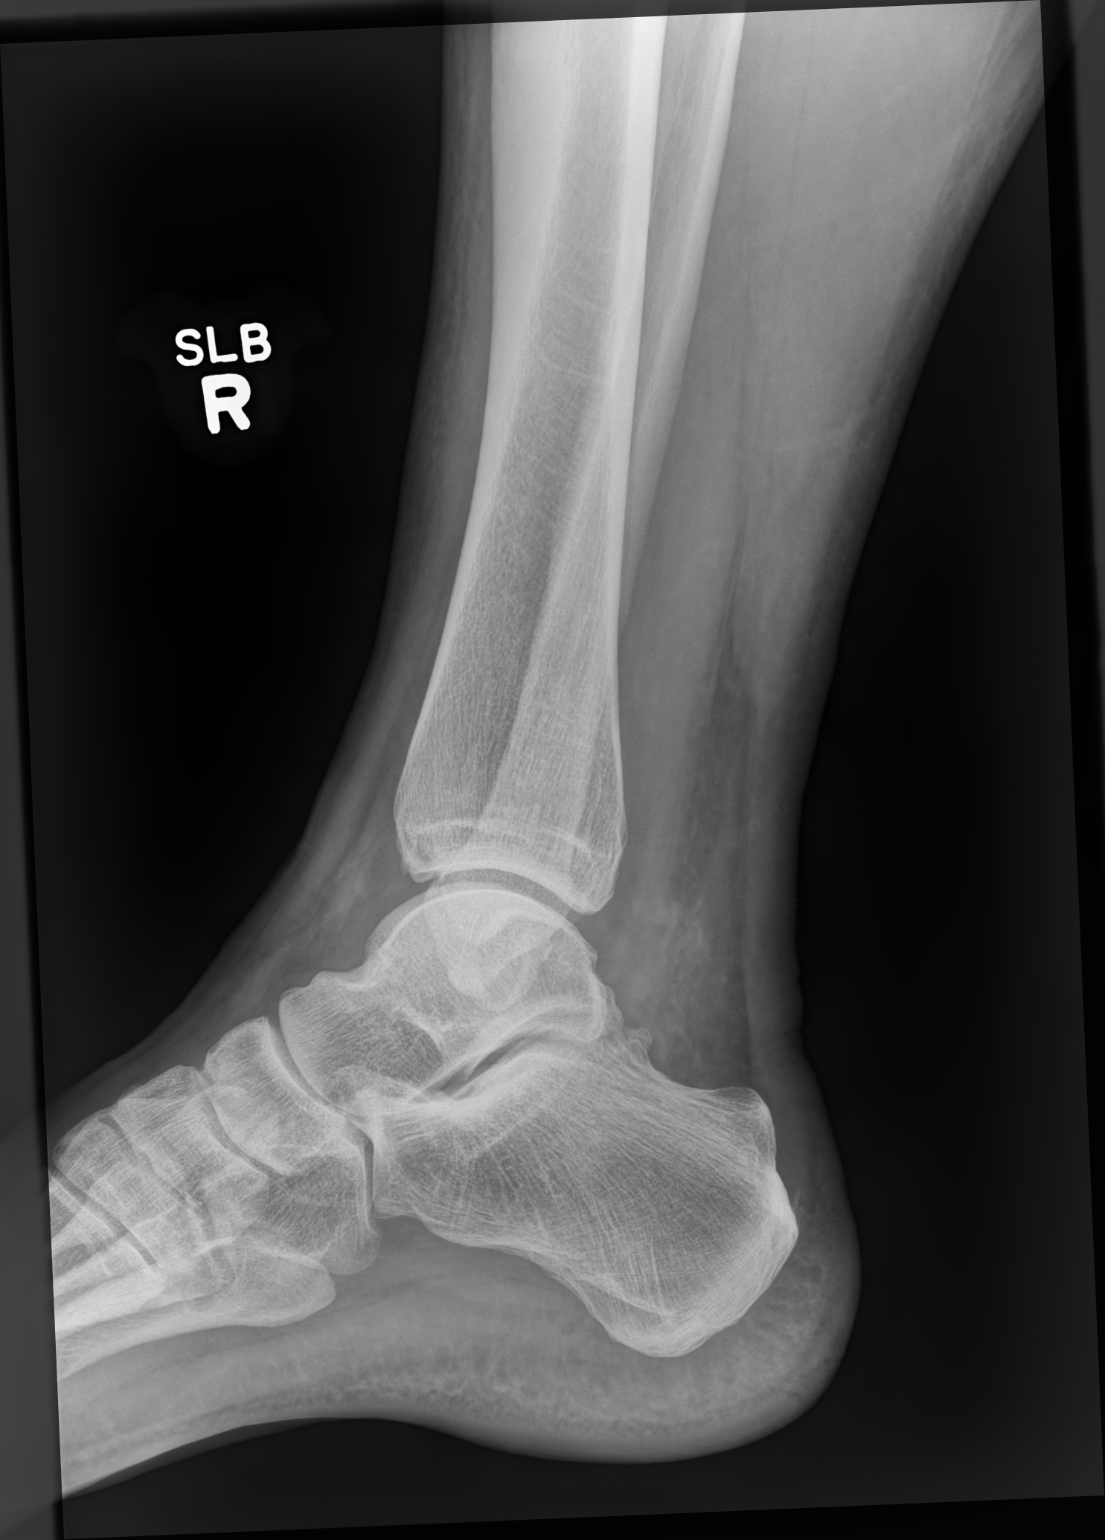

[3 of 3 positions shown; findings below may reference images not displayed]

FINDINGS: Diffuse soft tissue swelling.

Osseous mineralization normal.

Joint spaces preserved.

No acute fracture, dislocation, or bone destruction.
IMPRESSION: Soft tissue swelling without acute osseous abnormalities.

## 2022-09-14 ENCOUNTER — Encounter: Payer: Self-pay | Admitting: Radiology

## 2022-10-31 ENCOUNTER — Telehealth: Payer: Self-pay | Admitting: Family Medicine

## 2022-10-31 DIAGNOSIS — R739 Hyperglycemia, unspecified: Secondary | ICD-10-CM

## 2022-10-31 DIAGNOSIS — R7989 Other specified abnormal findings of blood chemistry: Secondary | ICD-10-CM

## 2022-10-31 DIAGNOSIS — E7849 Other hyperlipidemia: Secondary | ICD-10-CM

## 2022-10-31 NOTE — Telephone Encounter (Signed)
Patient has physical on 5/15 and would like labs done before appointment.

## 2022-11-03 NOTE — Telephone Encounter (Signed)
Blood work ordered in EPIC. Patient notified. 

## 2022-11-03 NOTE — Telephone Encounter (Signed)
Cook, Jayce G, DO     CBC, CMP, Lipid, A1C.  Thank you  Dr. Cook   

## 2022-11-28 DIAGNOSIS — R7989 Other specified abnormal findings of blood chemistry: Secondary | ICD-10-CM | POA: Diagnosis not present

## 2022-11-28 DIAGNOSIS — E7849 Other hyperlipidemia: Secondary | ICD-10-CM | POA: Diagnosis not present

## 2022-11-28 DIAGNOSIS — R739 Hyperglycemia, unspecified: Secondary | ICD-10-CM | POA: Diagnosis not present

## 2022-11-29 ENCOUNTER — Encounter: Payer: Self-pay | Admitting: Family Medicine

## 2022-11-29 ENCOUNTER — Ambulatory Visit (INDEPENDENT_AMBULATORY_CARE_PROVIDER_SITE_OTHER): Payer: 59 | Admitting: Family Medicine

## 2022-11-29 VITALS — BP 128/83 | HR 89 | Temp 98.6°F | Wt 241.0 lb

## 2022-11-29 DIAGNOSIS — R7989 Other specified abnormal findings of blood chemistry: Secondary | ICD-10-CM | POA: Insufficient documentation

## 2022-11-29 DIAGNOSIS — E7801 Familial hypercholesterolemia: Secondary | ICD-10-CM

## 2022-11-29 LAB — CBC WITH DIFFERENTIAL/PLATELET
Basophils Absolute: 0 10*3/uL (ref 0.0–0.2)
Basos: 1 %
EOS (ABSOLUTE): 0.1 10*3/uL (ref 0.0–0.4)
Eos: 2 %
Hematocrit: 46 % (ref 37.5–51.0)
Hemoglobin: 16.2 g/dL (ref 13.0–17.7)
Immature Grans (Abs): 0 10*3/uL (ref 0.0–0.1)
Immature Granulocytes: 0 %
Lymphocytes Absolute: 1.1 10*3/uL (ref 0.7–3.1)
Lymphs: 24 %
MCH: 32.4 pg (ref 26.6–33.0)
MCHC: 35.2 g/dL (ref 31.5–35.7)
MCV: 92 fL (ref 79–97)
Monocytes Absolute: 0.5 10*3/uL (ref 0.1–0.9)
Monocytes: 10 %
Neutrophils Absolute: 2.8 10*3/uL (ref 1.4–7.0)
Neutrophils: 63 %
Platelets: 140 10*3/uL — ABNORMAL LOW (ref 150–450)
RBC: 5 x10E6/uL (ref 4.14–5.80)
RDW: 12 % (ref 11.6–15.4)
WBC: 4.5 10*3/uL (ref 3.4–10.8)

## 2022-11-29 LAB — CMP14+EGFR
ALT: 127 IU/L — ABNORMAL HIGH (ref 0–44)
AST: 145 IU/L — ABNORMAL HIGH (ref 0–40)
Albumin/Globulin Ratio: 1.3 (ref 1.2–2.2)
Albumin: 4.3 g/dL (ref 4.1–5.1)
Alkaline Phosphatase: 83 IU/L (ref 44–121)
BUN/Creatinine Ratio: 12 (ref 9–20)
BUN: 11 mg/dL (ref 6–24)
Bilirubin Total: 1.6 mg/dL — ABNORMAL HIGH (ref 0.0–1.2)
CO2: 23 mmol/L (ref 20–29)
Calcium: 9 mg/dL (ref 8.7–10.2)
Chloride: 102 mmol/L (ref 96–106)
Creatinine, Ser: 0.93 mg/dL (ref 0.76–1.27)
Globulin, Total: 3.2 g/dL (ref 1.5–4.5)
Glucose: 101 mg/dL — ABNORMAL HIGH (ref 70–99)
Potassium: 4.1 mmol/L (ref 3.5–5.2)
Sodium: 141 mmol/L (ref 134–144)
Total Protein: 7.5 g/dL (ref 6.0–8.5)
eGFR: 105 mL/min/{1.73_m2} (ref >59)

## 2022-11-29 LAB — LIPID PANEL
Chol/HDL Ratio: 6.9 ratio — ABNORMAL HIGH (ref 0.0–5.0)
Cholesterol, Total: 324 mg/dL — ABNORMAL HIGH (ref 100–199)
HDL: 47 mg/dL (ref >39)
LDL Chol Calc (NIH): 254 mg/dL — ABNORMAL HIGH (ref 0–99)
Triglycerides: 126 mg/dL (ref 0–149)
VLDL Cholesterol Cal: 23 mg/dL (ref 5–40)

## 2022-11-29 LAB — HEMOGLOBIN A1C
Est. average glucose Bld gHb Est-mCnc: 120 mg/dL
Hgb A1c MFr Bld: 5.8 % — ABNORMAL HIGH (ref 4.8–5.6)

## 2022-11-29 MED ORDER — REPATHA SURECLICK 140 MG/ML ~~LOC~~ SOAJ: SUBCUTANEOUS | 4 refills | Status: DC

## 2022-11-29 NOTE — Patient Instructions (Signed)
Medication as prescribed.  Cut back.  Follow up in 3 months.

## 2022-11-29 NOTE — Assessment & Plan Note (Addendum)
Severe.  Uncontrolled and worsening.  Labs consistent with familial hyperlipidemia.  Does not tolerate statin.  Sending in Repatha.

## 2022-11-29 NOTE — Progress Notes (Signed)
Subjective:  Patient ID: Jeffrey Fischer, male    DOB: [birth year]  Age: 42 y.o. MRN: [id number]  CC: Chief Complaint  Patient presents with   Annual Exam    Discuss cholesterol - did not start medications    HPI:  42 year old male with familial hyperlipidemia presents for follow-up.  Patient is essentially here to discuss his labs.  Labs recently obtained and revealed elevated LFTs and bilirubin.  He has been drinking heavily.  He is going through a divorce.  We will discuss this today.  Patient clearly has familial hyperlipidemia.  LDL 254.  He has not been able to tolerate statin therapy.  Has caused severe myalgias and arthralgias.  I feel that he is a candidate for PCSK9.  Will discuss this today.  Patient Active Problem List   Diagnosis Date Noted   Elevated LFTs 11/29/2022   Internal hemorrhoids 09/12/2021   Prediabetes 09/12/2021   Elevated BP without diagnosis of hypertension 09/12/2021   Snoring 09/12/2021   Hyperlipidemia 09/26/2016    Social Hx   Social History   Socioeconomic History   Marital status: Married    Spouse name: Not on file   Number of children: Not on file   Years of education: Not on file   Highest education level: Not on file  Occupational History   Not on file  Tobacco Use   Smoking status: Former   Smokeless tobacco: Former    Quit date: 02/09/2014  Vaping Use   Vaping Use: Never used  Substance and Sexual Activity   Alcohol use: Yes    Alcohol/week: 30.0 standard drinks of alcohol    Types: 15 Cans of beer, 15 Shots of liquor per week   Drug use: Never   Sexual activity: Yes  Other Topics Concern   Not on file  Social History Narrative   Not on file   Social Determinants of Health   Financial Resource Strain: Not on file  Food Insecurity: Not on file  Transportation Needs: Not on file  Physical Activity: Not on file  Stress: Not on file  Social Connections: Not on file    Review of Systems Per HPI  Objective:  BP  128/83   Pulse 89   Temp 98.6 F (37 C)   Wt 241 lb (109.3 kg)   SpO2 97%   BMI 30.94 kg/m      11/29/2022    8:58 AM 11/15/2021    2:12 PM 11/14/2021    9:57 AM  BP/Weight  Systolic BP 128 149 144  Diastolic BP 83 96 90  Wt. (Lbs) 241 255 256  BMI 30.94 kg/m2 32.74 kg/m2 32.87 kg/m2    Physical Exam Vitals and nursing note reviewed.  Constitutional:      General: He is not in acute distress.    Appearance: Normal appearance.  HENT:     Head: Normocephalic and atraumatic.  Cardiovascular:     Rate and Rhythm: Normal rate and regular rhythm.  Pulmonary:     Effort: Pulmonary effort is normal.     Breath sounds: Normal breath sounds. No wheezing, rhonchi or rales.  Neurological:     Mental Status: He is alert.  Psychiatric:        Mood and Affect: Mood normal.        Behavior: Behavior normal.     Lab Results  Component Value Date   WBC 4.5 11/28/2022   HGB 16.2 11/28/2022   HCT 46.0 11/28/2022   PLT 140 (  L) 11/28/2022   GLUCOSE 101 (H) 11/28/2022   CHOL 324 (H) 11/28/2022   TRIG 126 11/28/2022   HDL 47 11/28/2022   LDLCALC 254 (H) 11/28/2022   ALT 127 (H) 11/28/2022   AST 145 (H) 11/28/2022   NA 141 11/28/2022   K 4.1 11/28/2022   CL 102 11/28/2022   CREATININE 0.93 11/28/2022   BUN 11 11/28/2022   CO2 23 11/28/2022   HGBA1C 5.8 (H) 11/28/2022     Assessment & Plan:   Problem List Items Addressed This Visit       Other   Elevated LFTs    We had a long discussion today about cutting back slowly.      Relevant Orders   Hepatic Function Panel   Hyperlipidemia - Primary    Severe.  Uncontrolled and worsening.  Labs consistent with familial hyperlipidemia.  Does not tolerate statin.  Sending in Repatha.      Relevant Medications   Evolocumab (REPATHA SURECLICK) 140 MG/ML SOAJ   Other Relevant Orders   Lipid Panel    Meds ordered this encounter  Medications   Evolocumab (REPATHA SURECLICK) 140 MG/ML SOAJ    Sig: Inject 140mg  every 14 days     Dispense:  6 mL    Refill:  4    Follow-up:  3 months  Yaxiel Minnie Adriana Simas DO [facility]

## 2022-11-29 NOTE — Assessment & Plan Note (Signed)
We had a long discussion today about cutting back slowly.

## 2022-12-01 ENCOUNTER — Other Ambulatory Visit: Payer: Self-pay

## 2022-12-07 ENCOUNTER — Telehealth: Payer: Self-pay

## 2022-12-07 NOTE — Telephone Encounter (Signed)
Pt is calling his insurance denied him for the Evolocumab (REPATHA SURECLICK) 140 MG/ML SOAJ  is there some thing else he can go on or send him to the lipid clinic?   [place] [phone]

## 2022-12-12 ENCOUNTER — Other Ambulatory Visit: Payer: Self-pay | Admitting: Pharmacist

## 2022-12-12 NOTE — Telephone Encounter (Signed)
[name], DO    Can you forward this to the pharmacist and see what she recommends please?

## 2022-12-12 NOTE — Telephone Encounter (Signed)
Hi!  This should have been approved I'm on covermymeds but his PA isn't coming up.    Autumn, do you have the denial letter/"key" info or covermymeds response that I can view?  In this patient case, insurance would require [place] to submit most recent lipid panels and write up why he needs (LDL >180, already on maximally tolerated statin, likely familial, etc) and attach.  I can appeal this if I have the denial letter or can see in covermymeds.  Also, with [facility] (& this high dollar med) we would likely need to send to a [facility].  We will try our best to get it! I do know [facility] prefers Repatha (so I would stick with this one!).  The PA for this can be very tricky.  Raynelle Fanning

## 2022-12-12 NOTE — Progress Notes (Addendum)
   12/12/2022 Name: ASAIAH GUMAN MRN: [id number] DOB: [birth year]  Chief Complaint  Patient presents with   Prior Auth    REPATHA    Lab Results  Component Value Date   CHOL 324 (H) 11/28/2022   HDL 47 11/28/2022   LDLCALC 254 (H) 11/28/2022   TRIG 126 11/28/2022   CHOLHDL 6.9 (H) 11/28/2022    Medications Reviewed Today     Reviewed by Danella Maiers, [facility] (Pharmacist) on 12/12/22 at 1336  Med List Status: <None>   Medication Order Taking? Sig Documenting Provider Last Dose Status Informant  Evolocumab [facility] SURECLICK) 140 MG/ML SOAJ [id number]  Inject 140mg  every 14 days Adriana Simas, Jayce G, DO  Active   mupirocin ointment (BACTROBAN) 2 % [id number] No Apply 1 application topically to affected area(s) twice daily.  Taking Active             Repatha PA submitted via covermymeds Patient's LFTs acutely elevated due to alcohol & potentially fatty liver. Would recommend Repatha due to current labs Documentation provided for PA [facility] & patient can go online to get copay card that makes Repatha $5/month Will f/u in 2-3 business days  **Update** called to clarify additional questions for insurance--PA should be approved by end of day today  Message from Plan The request has been approved. The authorization is effective from 12/15/2022 to 12/14/2023, as long as the member is enrolled in their current health plan. The request was approved as submitted. Please have the pharmacy contact [employer] at [phone] if billing assistance is required. A written notification letter will follow with additional details.. Authorization Expiration Date: Dec 14, 2023.  Called patient to notify him of approval, but mailbox was full.    Kieth Brightly, PharmD, BCACP Clinical Pharmacist, [facility]

## 2022-12-12 NOTE — Telephone Encounter (Signed)
I do not have the denial- patient called in and stated he received  denial and did not know if a different med could be called in or if he needed referral to lipid clinic for further intervention

## 2022-12-15 NOTE — Telephone Encounter (Signed)
Repatha approved  Call placed to patient with no answer/his VM was full  Repatha will likely need to be sent to [facility] pharmacy due to our [facility] plan Patient should go online to get $5 copay card -- he will have to call in copay card himself   Message from Plan The request has been approved. The authorization is effective from 12/15/2022 to 12/14/2023, as long as the member is enrolled in their current health plan. The request was approved as submitted. Please have the pharmacy contact [employer] Customer Service at [phone] if billing assistance is required. A written notification letter will follow with additional details.. Authorization Expiration Date: Dec 14, 2023.

## 2022-12-18 ENCOUNTER — Other Ambulatory Visit: Payer: Self-pay

## 2022-12-18 MED ORDER — REPATHA SURECLICK 140 MG/ML ~~LOC~~ SOAJ
140.0000 mg | SUBCUTANEOUS | 4 refills | Status: DC
  Filled 2022-12-18: qty 6, 84d supply, fill #0

## 2022-12-18 NOTE — Telephone Encounter (Signed)
Patient notified and would like it sent to [facility]. Prescription sent electronically to pharmacy.

## 2022-12-18 NOTE — Addendum Note (Signed)
Addended by: Margaretha Sheffield on: 12/18/2022 03:43 PM   Modules accepted: Orders

## 2022-12-18 NOTE — Telephone Encounter (Signed)
[name], DO     Please let patient know that this was approved. Also, please send this in to the [facility] health pharmacy of his choice.

## 2022-12-18 NOTE — Telephone Encounter (Signed)
Telephone call- mailbox is full 

## 2023-02-15 ENCOUNTER — Telehealth: Payer: 59 | Admitting: Physician Assistant

## 2023-02-15 DIAGNOSIS — J208 Acute bronchitis due to other specified organisms: Secondary | ICD-10-CM

## 2023-02-15 DIAGNOSIS — B9689 Other specified bacterial agents as the cause of diseases classified elsewhere: Secondary | ICD-10-CM | POA: Diagnosis not present

## 2023-02-15 MED ORDER — PREDNISONE 20 MG PO TABS: 40.0000 mg | ORAL_TABLET | Freq: Every day | ORAL | 0 refills | Status: DC

## 2023-02-15 MED ORDER — AZITHROMYCIN 250 MG PO TABS
ORAL_TABLET | ORAL | 0 refills | Status: AC
Start: 2023-02-15 — End: 2023-02-20

## 2023-02-15 MED ORDER — BENZONATATE 100 MG PO CAPS: 100.0000 mg | ORAL_CAPSULE | Freq: Three times a day (TID) | ORAL | 0 refills | Status: DC | PRN

## 2023-02-15 NOTE — Progress Notes (Signed)
I have spent 5 minutes in review of e-visit questionnaire, review and updating patient chart, medical decision making and response to patient.   William Cody Martin, PA-C    

## 2023-02-15 NOTE — Progress Notes (Signed)
E-Visit for Cough  We are sorry that you are not feeling well.  Here is how we plan to help!  Based on your presentation I believe you most likely have A cough due to bacteria.  When patients have a productive cough with a change in color or increased sputum production, we are concerned about bacterial bronchitis.  If left untreated it can progress to pneumonia.  If your symptoms do not improve with your treatment plan it is important that you contact your provider.   I have prescribed Azithromyin 250 mg: two tablets now and then one tablet daily for 4 additonal days    In addition you may use A prescription cough medication called Tessalon Perles 100mg . You may take 1-2 capsules every 8 hours as needed for your cough.  I have also sent in a short script for prednisone to open airways and reduce inflammation.   From your responses in the eVisit questionnaire you describe inflammation in the upper respiratory tract which is causing a significant cough.  This is commonly called Bronchitis and has four common causes:   Allergies Viral Infections Acid Reflux Bacterial Infection Allergies, viruses and acid reflux are treated by controlling symptoms or eliminating the cause. An example might be a cough caused by taking certain blood pressure medications. You stop the cough by changing the medication. Another example might be a cough caused by acid reflux. Controlling the reflux helps control the cough.  USE OF BRONCHODILATOR ("RESCUE") INHALERS: There is a risk from using your bronchodilator too frequently.  The risk is that over-reliance on a medication which only relaxes the muscles surrounding the breathing tubes can reduce the effectiveness of medications prescribed to reduce swelling and congestion of the tubes themselves.  Although you feel brief relief from the bronchodilator inhaler, your asthma may actually be worsening with the tubes becoming more swollen and filled with mucus.  This can delay  other crucial treatments, such as oral steroid medications. If you need to use a bronchodilator inhaler daily, several times per day, you should discuss this with your provider.  There are probably better treatments that could be used to keep your asthma under control.     HOME CARE Only take medications as instructed by your medical team. Complete the entire course of an antibiotic. Drink plenty of fluids and get plenty of rest. Avoid close contacts especially the very young and the elderly Cover your mouth if you cough or cough into your sleeve. Always remember to wash your hands A steam or ultrasonic humidifier can help congestion.   GET HELP RIGHT AWAY IF: You develop worsening fever. You become short of breath You cough up blood. Your symptoms persist after you have completed your treatment plan MAKE SURE YOU  Understand these instructions. Will watch your condition. Will get help right away if you are not doing well or get worse.    Thank you for choosing an e-visit.  Your e-visit answers were reviewed by a board certified advanced clinical practitioner to complete your personal care plan. Depending upon the condition, your plan could have included both over the counter or prescription medications.  Please review your pharmacy choice. Make sure the pharmacy is open so you can pick up prescription now. If there is a problem, you may contact your provider through [employer] and have the prescription routed to another pharmacy.  Your safety is important to [place]. If you have drug allergies check your prescription carefully.   For the next 24  hours you can use MyChart to ask questions about today's visit, request a non-urgent call back, or ask for a work or school excuse. You will get an email in the next two days asking about your experience. I hope that your e-visit has been valuable and will speed your recovery.

## 2023-03-02 ENCOUNTER — Ambulatory Visit: Payer: 59 | Admitting: Family Medicine

## 2023-03-02 ENCOUNTER — Telehealth: Payer: Self-pay

## 2023-03-02 VITALS — BP 129/86 | HR 100 | Temp 98.2°F | Ht 74.0 in | Wt 239.4 lb

## 2023-03-02 DIAGNOSIS — R030 Elevated blood-pressure reading, without diagnosis of hypertension: Secondary | ICD-10-CM

## 2023-03-02 DIAGNOSIS — R7989 Other specified abnormal findings of blood chemistry: Secondary | ICD-10-CM | POA: Diagnosis not present

## 2023-03-02 DIAGNOSIS — K625 Hemorrhage of anus and rectum: Secondary | ICD-10-CM

## 2023-03-02 DIAGNOSIS — K648 Other hemorrhoids: Secondary | ICD-10-CM | POA: Diagnosis not present

## 2023-03-02 DIAGNOSIS — E7849 Other hyperlipidemia: Secondary | ICD-10-CM

## 2023-03-02 MED ORDER — HYDROCORTISONE ACETATE 25 MG RE SUPP: 25.0000 mg | Freq: Two times a day (BID) | RECTAL | 0 refills | Status: AC

## 2023-03-02 NOTE — Telephone Encounter (Signed)
Dr. Tobi Bastos wants to move the patient appointment from 03/05/2023 at 1:00 to Friday 03/09/2023 at 11:45. Tried to call patient but voicemail is not set up unable to leave a message. Can you please try to call on Monday morning

## 2023-03-02 NOTE — Patient Instructions (Addendum)
Labs today.  Medication as prescribed  Appt at [facility] in [place] on Monday 8/19 @ 1 pm.

## 2023-03-04 NOTE — Assessment & Plan Note (Addendum)
Exam deferred as he is going to see GI on Monday. Anusol suppository.

## 2023-03-04 NOTE — Assessment & Plan Note (Signed)
BP 129/86 today.  Will continue to monitor.

## 2023-03-04 NOTE — Progress Notes (Signed)
Subjective:  Patient ID: Jeffrey Fischer, male    DOB: 1980/09/07  Age: 42 y.o. MRN: [id number]  CC: Follow-up   HPI:  42 year old male with a history of internal hemorrhoids, prediabetes, elevated BP, hyperlipidemia, elevated LFTs presents for follow-up.  Patient's last labs revealed elevated liver enzymes.  Secondary to alcohol use.  He states that his alcohol use improved.  Needs lab recheck.  Patient has severe hyperlipidemia, likely secondary to familial hyperlipidemia.  He has gotten Repatha and is compliant.  Needs recheck of lipids.  Additionally, patient reports that he is having frequent, essentially daily hematochezia.  He states that this has been going on for months.  Previously had hemorrhoid banding.  He believes that this is the cause of his hematochezia as he can feel hemorrhoids when he uses the restroom.  He would like to see GI urgently.    Patient Active Problem List   Diagnosis Date Noted   Elevated LFTs 11/29/2022   Internal hemorrhoids 09/12/2021   Prediabetes 09/12/2021   Elevated BP without diagnosis of hypertension 09/12/2021   Snoring 09/12/2021   Hyperlipidemia 09/26/2016    Social Hx   Social History   Socioeconomic History   Marital status: Married    Spouse name: Not on file   Number of children: Not on file   Years of education: Not on file   Highest education level: Not on file  Occupational History   Not on file  Tobacco Use   Smoking status: Former   Smokeless tobacco: Former    Quit date: 02/09/2014  Vaping Use   Vaping status: Never Used  Substance and Sexual Activity   Alcohol use: Yes    Alcohol/week: 30.0 standard drinks of alcohol    Types: 15 Cans of beer, 15 Shots of liquor per week   Drug use: Never   Sexual activity: Yes  Other Topics Concern   Not on file  Social History Narrative   Not on file   Social Determinants of Health   Financial Resource Strain: Not on file  Food Insecurity: Not on file  Transportation  Needs: Not on file  Physical Activity: Not on file  Stress: Not on file  Social Connections: Not on file    Review of Systems Per HPI  Objective:  BP 129/86   Pulse 100   Temp 98.2 F (36.8 C)   Ht 6\' 2"  (1.88 m)   Wt 239 lb 6.4 oz (108.6 kg)   SpO2 98%   BMI 30.74 kg/m      03/02/2023    9:41 AM 11/29/2022    8:58 AM 11/15/2021    2:12 PM  BP/Weight  Systolic BP 129 128 149  Diastolic BP 86 83 96  Wt. (Lbs) 239.4 241 255  BMI 30.74 kg/m2 30.94 kg/m2 32.74 kg/m2    Physical Exam Vitals and nursing note reviewed.  Constitutional:      General: He is not in acute distress.    Appearance: Normal appearance.  HENT:     Head: Normocephalic and atraumatic.  Cardiovascular:     Rate and Rhythm: Normal rate and regular rhythm.  Pulmonary:     Effort: Pulmonary effort is normal.     Breath sounds: Normal breath sounds.  Genitourinary:    Comments: Rectal exam deferred.   Neurological:     Mental Status: He is alert.  Psychiatric:        Mood and Affect: Mood normal.        Behavior:  Behavior normal.    Lab Results  Component Value Date   WBC 4.5 11/28/2022   HGB 16.2 11/28/2022   HCT 46.0 11/28/2022   PLT 140 (L) 11/28/2022   GLUCOSE 101 (H) 11/28/2022   CHOL 324 (H) 11/28/2022   TRIG 126 11/28/2022   HDL 47 11/28/2022   LDLCALC 254 (H) 11/28/2022   ALT 127 (H) 11/28/2022   AST 145 (H) 11/28/2022   NA 141 11/28/2022   K 4.1 11/28/2022   CL 102 11/28/2022   CREATININE 0.93 11/28/2022   BUN 11 11/28/2022   CO2 23 11/28/2022   HGBA1C 5.8 (H) 11/28/2022     Assessment & Plan:   Problem List Items Addressed This Visit       Cardiovascular and Mediastinum   Internal hemorrhoids    Exam deferred as he is going to see GI on Monday. Anusol suppository.      Relevant Orders   CBC     Other   Hyperlipidemia    Rechecking lipids.  Continue Repatha.      Relevant Orders   Lipid panel   Elevated LFTs - Primary    Rechecking liver enzymes.   Continue avoiding alcohol.      Relevant Orders   Hepatic Function Panel   Elevated BP without diagnosis of hypertension    BP 129/86 today.  Will continue to monitor.      Other Visit Diagnoses     BRBPR (bright red blood per rectum)       Relevant Orders   CBC       Meds ordered this encounter  Medications   hydrocortisone (ANUSOL-HC) 25 MG suppository    Sig: Place 1 suppository (25 mg total) rectally 2 (two) times daily.    Dispense:  30 suppository    Refill:  0    Follow-up:  Return in about 6 months (around 09/02/2023).  Everlene Other DO [facility]

## 2023-03-04 NOTE — Assessment & Plan Note (Signed)
Rechecking lipids.  Continue Repatha.

## 2023-03-04 NOTE — Assessment & Plan Note (Signed)
Rechecking liver enzymes.  Continue avoiding alcohol.

## 2023-03-05 ENCOUNTER — Encounter: Payer: Self-pay | Admitting: Gastroenterology

## 2023-03-05 ENCOUNTER — Ambulatory Visit: Payer: 59 | Admitting: Gastroenterology

## 2023-03-05 VITALS — BP 150/92 | HR 88 | Temp 98.3°F | Wt 236.0 lb

## 2023-03-05 DIAGNOSIS — K625 Hemorrhage of anus and rectum: Secondary | ICD-10-CM | POA: Diagnosis not present

## 2023-03-05 DIAGNOSIS — K602 Anal fissure, unspecified: Secondary | ICD-10-CM

## 2023-03-05 DIAGNOSIS — R198 Other specified symptoms and signs involving the digestive system and abdomen: Secondary | ICD-10-CM

## 2023-03-05 NOTE — Progress Notes (Signed)
Patient follow-ups today for banding of hemorrhoids    Summary of history :  Here today to see me for rectal bleeding presumed to be secondary to hemorrhoids.  He has had internal hemorrhoids that have been banded in the past.  He did well for about 6 months and the bleeding recurred since.  Also complains of discomfort associate with a bowel movement denies any constipation.   Digital rectal exam performed in the presence of a chaperone. 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  In addition I have suggested him to use sitz bath's in the meanwhile.  Follow-up: 4 to 6 weeks  Dr Wyline Mood MD,MRCP [facility]) Gastroenterology/Hepatology Pager: [phone]

## 2023-03-05 NOTE — Patient Instructions (Addendum)
Please go to [facility] to pick up your cream to apply. Please give them a day to make sure they have it ready for you.  [facility] Phone: [phone] [address] [place], [place] [address]

## 2023-04-05 ENCOUNTER — Ambulatory Visit: Payer: 59 | Admitting: Gastroenterology

## 2023-05-08 ENCOUNTER — Ambulatory Visit: Payer: 59 | Admitting: Gastroenterology

## 2023-05-14 ENCOUNTER — Telehealth: Payer: 59 | Admitting: Physician Assistant

## 2023-05-14 DIAGNOSIS — J208 Acute bronchitis due to other specified organisms: Secondary | ICD-10-CM

## 2023-05-14 MED ORDER — PREDNISONE 20 MG PO TABS: 40.0000 mg | ORAL_TABLET | Freq: Every day | ORAL | 0 refills | Status: DC

## 2023-05-14 MED ORDER — BENZONATATE 100 MG PO CAPS
100.0000 mg | ORAL_CAPSULE | Freq: Three times a day (TID) | ORAL | 0 refills | Status: AC | PRN
Start: 2023-05-14 — End: ?

## 2023-05-14 NOTE — Progress Notes (Signed)
E-Visit for Cough  We are sorry that you are not feeling well.  Here is how we plan to help!  Based on your presentation I believe you most likely have A cough due to a virus.  This is called viral bronchitis and is best treated by rest, plenty of fluids and control of the cough.  You may use Ibuprofen or Tylenol as directed to help your symptoms.     In addition you may use A non-prescription cough medication called Mucinex DM: take 2 tablets every 12 hours. and A prescription cough medication called Tessalon Perles 100mg . You may take 1-2 capsules every 8 hours as needed for your cough.  Prednisone 20mg  Take 2 tablets (40mg ) daily for 5 days  From your responses in the eVisit questionnaire you describe inflammation in the upper respiratory tract which is causing a significant cough.  This is commonly called Bronchitis and has four common causes:   Allergies Viral Infections Acid Reflux Bacterial Infection Allergies, viruses and acid reflux are treated by controlling symptoms or eliminating the cause. An example might be a cough caused by taking certain blood pressure medications. You stop the cough by changing the medication. Another example might be a cough caused by acid reflux. Controlling the reflux helps control the cough.  USE OF BRONCHODILATOR ("RESCUE") INHALERS: There is a risk from using your bronchodilator too frequently.  The risk is that over-reliance on a medication which only relaxes the muscles surrounding the breathing tubes can reduce the effectiveness of medications prescribed to reduce swelling and congestion of the tubes themselves.  Although you feel brief relief from the bronchodilator inhaler, your asthma may actually be worsening with the tubes becoming more swollen and filled with mucus.  This can delay other crucial treatments, such as oral steroid medications. If you need to use a bronchodilator inhaler daily, several times per day, you should discuss this with your  provider.  There are probably better treatments that could be used to keep your asthma under control.     HOME CARE Only take medications as instructed by your medical team. Complete the entire course of an antibiotic. Drink plenty of fluids and get plenty of rest. Avoid close contacts especially the very young and the elderly Cover your mouth if you cough or cough into your sleeve. Always remember to wash your hands A steam or ultrasonic humidifier can help congestion.   GET HELP RIGHT AWAY IF: You develop worsening fever. You become short of breath You cough up blood. Your symptoms persist after you have completed your treatment plan MAKE SURE YOU  Understand these instructions. Will watch your condition. Will get help right away if you are not doing well or get worse.    Thank you for choosing an e-visit.  Your e-visit answers were reviewed by a board certified advanced clinical practitioner to complete your personal care plan. Depending upon the condition, your plan could have included both over the counter or prescription medications.  Please review your pharmacy choice. Make sure the pharmacy is open so you can pick up prescription now. If there is a problem, you may contact your provider through [employer] and have the prescription routed to another pharmacy.  Your safety is important to [place]. If you have drug allergies check your prescription carefully.   For the next 24 hours you can use MyChart to ask questions about today's visit, request a non-urgent call back, or ask for a work or school excuse. You will get an  email in the next two days asking about your experience. I hope that your e-visit has been valuable and will speed your recovery.  I have spent 5 minutes in review of e-visit questionnaire, review and updating patient chart, medical decision making and response to patient.   Margaretann Loveless, PA-C

## 2023-05-20 ENCOUNTER — Telehealth: Payer: 59 | Admitting: Physician Assistant

## 2023-05-20 DIAGNOSIS — J069 Acute upper respiratory infection, unspecified: Secondary | ICD-10-CM

## 2023-05-20 MED ORDER — AMOXICILLIN-POT CLAVULANATE 875-125 MG PO TABS
1.0000 | ORAL_TABLET | Freq: Two times a day (BID) | ORAL | 0 refills | Status: DC
Start: 2023-05-20 — End: 2023-06-22

## 2023-05-20 NOTE — Progress Notes (Addendum)
E-Visit for Sinus Problems  We are sorry that you are not feeling well.  Here is how we plan to help!  Based on what you have shared with me it looks like you have sinusitis.  Sinusitis is inflammation and infection in the sinus cavities of the head.  Based on your presentation I believe you most likely have Acute Bacterial Sinusitis.  This is an infection caused by bacteria and is treated with antibiotics. I have prescribed Augmentin 875mg /125mg  one tablet twice daily with food, for 7 days. You may use an oral decongestant such as Mucinex D or if you have glaucoma or high blood pressure use plain Mucinex. Saline nasal spray help and can safely be used as often as needed for congestion.  If you develop worsening sinus pain, fever or notice severe headache and vision changes, or if symptoms are not better after completion of antibiotic, please schedule an appointment with a health care provider.    Recommend Mucinex and Flonase for congestion.  Recommend Delsym cough syrup for cough. Recommend Ibuprofen or Tylenol as needed.   Sinus infections are not as easily transmitted as other respiratory infection, however we still recommend that you avoid close contact with loved ones, especially the very young and elderly.  Remember to wash your hands thoroughly throughout the day as this is the number one way to prevent the spread of infection!  Home Care: Only take medications as instructed by your medical team. Complete the entire course of an antibiotic. Do not take these medications with alcohol. A steam or ultrasonic humidifier can help congestion.  You can place a towel over your head and breathe in the steam from hot water coming from a faucet. Avoid close contacts especially the very young and the elderly. Cover your mouth when you cough or sneeze. Always remember to wash your hands.  Get Help Right Away If: You develop worsening fever or sinus pain. You develop a severe head ache or visual  changes. Your symptoms persist after you have completed your treatment plan.  Make sure you Understand these instructions. Will watch your condition. Will get help right away if you are not doing well or get worse.  Thank you for choosing an e-visit.  Your e-visit answers were reviewed by a board certified advanced clinical practitioner to complete your personal care plan. Depending upon the condition, your plan could have included both over the counter or prescription medications.  Please review your pharmacy choice. Make sure the pharmacy is open so you can pick up prescription now. If there is a problem, you may contact your provider through [employer] and have the prescription routed to another pharmacy.  Your safety is important to [place]. If you have drug allergies check your prescription carefully.   For the next 24 hours you can use MyChart to ask questions about today's visit, request a non-urgent call back, or ask for a work or school excuse. You will get an email in the next two days asking about your experience. I hope that your e-visit has been valuable and will speed your recovery.   I have spent 5 minutes in review of e-visit questionnaire, review and updating patient chart, medical decision making and response to patient.   Tylene Fantasia Ward, PA-C

## 2023-06-22 ENCOUNTER — Ambulatory Visit: Payer: 59 | Admitting: Nurse Practitioner

## 2023-06-22 ENCOUNTER — Other Ambulatory Visit (HOSPITAL_COMMUNITY): Payer: Self-pay

## 2023-06-22 ENCOUNTER — Encounter: Payer: Self-pay | Admitting: Nurse Practitioner

## 2023-06-22 VITALS — BP 137/94 | HR 104 | Temp 99.1°F | Ht 74.0 in | Wt 243.8 lb

## 2023-06-22 DIAGNOSIS — Z720 Tobacco use: Secondary | ICD-10-CM | POA: Diagnosis not present

## 2023-06-22 DIAGNOSIS — J300 Vasomotor rhinitis: Secondary | ICD-10-CM

## 2023-06-22 DIAGNOSIS — E7849 Other hyperlipidemia: Secondary | ICD-10-CM

## 2023-06-22 DIAGNOSIS — R030 Elevated blood-pressure reading, without diagnosis of hypertension: Secondary | ICD-10-CM | POA: Diagnosis not present

## 2023-06-22 DIAGNOSIS — R490 Dysphonia: Secondary | ICD-10-CM | POA: Insufficient documentation

## 2023-06-22 MED ORDER — AMOXICILLIN-POT CLAVULANATE 875-125 MG PO TABS: 1.0000 | ORAL_TABLET | Freq: Two times a day (BID) | ORAL | 0 refills | Status: DC

## 2023-06-22 MED ORDER — REPATHA SURECLICK 140 MG/ML ~~LOC~~ SOAJ
140.0000 mg | SUBCUTANEOUS | 0 refills | Status: AC
  Filled 2023-06-22: qty 6, 84d supply, fill #0

## 2023-06-22 MED ORDER — AMOXICILLIN-POT CLAVULANATE 875-125 MG PO TABS
1.0000 | ORAL_TABLET | Freq: Two times a day (BID) | ORAL | 0 refills | Status: DC
  Filled 2023-06-22: qty 20, 10d supply, fill #0

## 2023-06-22 NOTE — Patient Instructions (Signed)
Bowel probiotic (Align) Flonase and Nasacort AQ

## 2023-06-22 NOTE — Progress Notes (Signed)
Subjective:    Patient ID: Jeffrey Fischer, male    DOB: 07-16-1981, 42 y.o.   MRN: [id number]  HPI Presents for complaints of hoarseness over the past 7 weeks.  Was recently prescribed Augmentin, symptoms much better while he was on it, states he did not complete as prescribed.  Smokes cigarettes occasionally, noticed after smoking the other day that the hoarseness came back.  For the past 2 to 3 months, smokes 5 cigarettes "here and there".  Chews tobacco every day.  Also alcohol use daily.  No sore throat.  No difficulty swallowing.  No fevers.  Denies any vaping or marijuana use.  No acid reflux or heartburn symptoms.  No abdominal pain.  Same male sexual partner for over a year. Also requesting a refill of his Repatha, has been off this medication for about 6 months.    Review of Systems  Constitutional:  Negative for fever.  HENT:  Positive for congestion. Negative for ear pain, sore throat and trouble swallowing.   Respiratory:  Positive for cough. Negative for chest tightness, shortness of breath and wheezing.        Occasional mild cough.  Cardiovascular:  Negative for chest pain.       Objective:   Physical Exam NAD.  Alert, oriented.  TMs clear effusion, no erythema.  Nasal mucosa pale and slightly boggy.  Pharynx moderate generalized erythema, no lesions or exudate noted.  Mucous membranes moist.  Neck supple with mild soft anterior cervical adenopathy.  Lungs clear.  Heart regular rate rhythm.  Abdomen soft nondistended nontender. Today's Vitals   06/22/23 0905 06/22/23 1002  BP: (!) 147/94 (!) 137/94  Pulse: (!) 104   Temp: 99.1 F (37.3 C)   SpO2: 99%   Weight: 243 lb 12.8 oz (110.6 kg)   Height: 6\' 2"  (1.88 m)    Body mass index is 31.3 kg/m.        Assessment & Plan:   Problem List Items Addressed This Visit       Respiratory   Vasomotor rhinitis   Relevant Orders   Ambulatory referral to ENT     Other   Elevated BP without diagnosis of  hypertension   Hyperlipidemia   Relevant Medications   Evolocumab (REPATHA SURECLICK) 140 MG/ML SOAJ   Persistent hoarseness - Primary   Relevant Orders   Culture, Group A Strep   Ambulatory referral to ENT   Tobacco use   Relevant Orders   Ambulatory referral to ENT   Meds ordered this encounter  Medications   Evolocumab (REPATHA SURECLICK) 140 MG/ML SOAJ    Sig: Inject 140 mg into the skin every 14 (fourteen) days. Repeat labs 2 months after restarting    Dispense:  6 mL    Refill:  0    Order Specific Question:   Supervising Provider    Answer:   Lilyan Punt A [[id number]]   amoxicillin-clavulanate (AUGMENTIN) 875-125 MG tablet    Sig: Take 1 tablet by mouth 2 (two) times daily.    Dispense:  20 tablet    Refill:  0    Order Specific Question:   Supervising Provider    Answer:   Lilyan Punt A [[id number]]   Restart Augmentin as directed.  Recommend bowel probiotic as a precaution. Start steroid nasal spray as directed. Refill on Repatha.  Lab work was ordered in August, patient to obtain this after he has been back on the medication for about 8 weeks. Referred to ENT for  evaluation of persistent hoarseness and considering his risk factors. Patient to call back in the meantime if needed.  Schedule routine follow-up with Dr. Adriana Simas.

## 2023-06-25 LAB — SPECIMEN STATUS REPORT

## 2023-06-25 LAB — CULTURE, GROUP A STREP: Strep A Culture: NEGATIVE

## 2023-06-26 ENCOUNTER — Other Ambulatory Visit (HOSPITAL_COMMUNITY): Payer: Self-pay

## 2023-06-26 ENCOUNTER — Encounter: Payer: Self-pay | Admitting: Gastroenterology

## 2023-06-26 ENCOUNTER — Encounter (INDEPENDENT_AMBULATORY_CARE_PROVIDER_SITE_OTHER): Payer: Self-pay | Admitting: Otolaryngology

## 2023-06-26 ENCOUNTER — Ambulatory Visit (INDEPENDENT_AMBULATORY_CARE_PROVIDER_SITE_OTHER): Payer: 59 | Admitting: Gastroenterology

## 2023-06-26 VITALS — BP 154/97 | HR 111 | Temp 98.3°F | Wt 240.0 lb

## 2023-06-26 DIAGNOSIS — K644 Residual hemorrhoidal skin tags: Secondary | ICD-10-CM | POA: Diagnosis not present

## 2023-06-26 DIAGNOSIS — K625 Hemorrhage of anus and rectum: Secondary | ICD-10-CM | POA: Diagnosis not present

## 2023-06-26 DIAGNOSIS — K602 Anal fissure, unspecified: Secondary | ICD-10-CM

## 2023-06-26 DIAGNOSIS — K648 Other hemorrhoids: Secondary | ICD-10-CM

## 2023-06-26 NOTE — Patient Instructions (Signed)
Please take Miralax 17 grams twice a day to help you pass your stools easier.  We will sed the referral to the general surgeon (Dr. Aleen Campi) to do botox injections. Once you are done with him, please let us know so we could schedule you your hemorrhoid banding.  We will send a prescription to [facility] in [place]. Please give them 24 hours to get it ready for you.

## 2023-06-26 NOTE — Progress Notes (Signed)
Patient follow-ups today for banding of hemorrhoids    Summary of history : Here today to see me for rectal bleeding presumed to be secondary to hemorrhoids. He has had internal hemorrhoids that have been banded in the past. He did well for about 6 months and the bleeding recurred since. Also complains of discomfort associate with a bowel movement denies any constipation.     Interval history  03/05/2023-06/26/2023  Treated with topical nifedipine for 6 weeks for anal fissure.  Still has pain when he has a bowel movement feels like a knife cutting through him. Chaperone present in the room Digital rectal exam performed in the presence of a chaperone. External anal findings: Skin tag present Internal findings: Unable to insert the finger significant tape pain and tenderness in the posterior anal canal suggestive of an anal fissure did not proceed further   Plan:  Anal fissure treated for 6 weeks with topical nifedipine helped to a great deal with the pain but still has significant symptoms unable to perform a rectal exam due to pain.  I will refer him to surgery for Botox injection.  Follow-up: 2 to 3 months  Dr Wyline Mood MD,MRCP [facility]) Gastroenterology/Hepatology Pager: [phone]

## 2023-06-27 ENCOUNTER — Ambulatory Visit: Payer: 59 | Admitting: Surgery

## 2023-07-06 ENCOUNTER — Ambulatory Visit (INDEPENDENT_AMBULATORY_CARE_PROVIDER_SITE_OTHER): Payer: 59 | Admitting: Surgery

## 2023-07-06 ENCOUNTER — Encounter: Payer: Self-pay | Admitting: Surgery

## 2023-07-06 VITALS — BP 143/91 | HR 91 | Temp 98.4°F | Ht 74.0 in | Wt 239.0 lb

## 2023-07-06 DIAGNOSIS — Z5321 Procedure and treatment not carried out due to patient leaving prior to being seen by health care provider: Secondary | ICD-10-CM

## 2023-07-15 NOTE — Progress Notes (Signed)
07/06/23 Patient left without being seen due to long waiting times.  Patient has rescheduled his appointment.  Henrene Dodge, MD

## 2023-07-23 ENCOUNTER — Encounter (HOSPITAL_BASED_OUTPATIENT_CLINIC_OR_DEPARTMENT_OTHER): Payer: Self-pay | Admitting: Emergency Medicine

## 2023-07-23 ENCOUNTER — Other Ambulatory Visit: Payer: Self-pay

## 2023-07-23 ENCOUNTER — Emergency Department (HOSPITAL_BASED_OUTPATIENT_CLINIC_OR_DEPARTMENT_OTHER)
Admission: EM | Admit: 2023-07-23 | Discharge: 2023-07-23 | Disposition: A | Discharge status: Home / Self Care | Payer: Self-pay | Attending: Emergency Medicine | Admitting: Emergency Medicine

## 2023-07-23 ENCOUNTER — Emergency Department (HOSPITAL_BASED_OUTPATIENT_CLINIC_OR_DEPARTMENT_OTHER): Payer: Self-pay

## 2023-07-23 DIAGNOSIS — R7401 Elevation of levels of liver transaminase levels: Secondary | ICD-10-CM | POA: Insufficient documentation

## 2023-07-23 DIAGNOSIS — Z20822 Contact with and (suspected) exposure to covid-19: Secondary | ICD-10-CM | POA: Insufficient documentation

## 2023-07-23 DIAGNOSIS — J040 Acute laryngitis: Secondary | ICD-10-CM | POA: Insufficient documentation

## 2023-07-23 DIAGNOSIS — R221 Localized swelling, mass and lump, neck: Secondary | ICD-10-CM

## 2023-07-23 DIAGNOSIS — F17200 Nicotine dependence, unspecified, uncomplicated: Secondary | ICD-10-CM | POA: Insufficient documentation

## 2023-07-23 LAB — COMPREHENSIVE METABOLIC PANEL
ALT: 98 U/L — ABNORMAL HIGH (ref 0–44)
AST: 114 U/L — ABNORMAL HIGH (ref 15–41)
Albumin: 4.5 g/dL (ref 3.5–5.0)
Alkaline Phosphatase: 83 U/L (ref 38–126)
Anion gap: 9 (ref 5–15)
BUN: 16 mg/dL (ref 6–20)
CO2: 28 mmol/L (ref 22–32)
Calcium: 9.3 mg/dL (ref 8.9–10.3)
Chloride: 100 mmol/L (ref 98–111)
Creatinine, Ser: 0.87 mg/dL (ref 0.61–1.24)
GFR, Estimated: >60 mL/min (ref >60)
Glucose, Bld: 84 mg/dL (ref 70–99)
Potassium: 3.7 mmol/L (ref 3.5–5.1)
Sodium: 137 mmol/L (ref 135–145)
Total Bilirubin: 3.3 mg/dL — ABNORMAL HIGH (ref 0.0–1.2)
Total Protein: 8.1 g/dL (ref 6.5–8.1)

## 2023-07-23 LAB — CBC WITH DIFFERENTIAL/PLATELET
Abs Immature Granulocytes: 0.01 10*3/uL (ref 0.00–0.07)
Basophils Absolute: 0.1 10*3/uL (ref 0.0–0.1)
Basophils Relative: 1 %
Eosinophils Absolute: 0.1 10*3/uL (ref 0.0–0.5)
Eosinophils Relative: 2 %
HCT: 46.3 % (ref 39.0–52.0)
Hemoglobin: 16.7 g/dL (ref 13.0–17.0)
Immature Granulocytes: 0 %
Lymphocytes Relative: 21 %
Lymphs Abs: 1.4 10*3/uL (ref 0.7–4.0)
MCH: 32.7 pg (ref 26.0–34.0)
MCHC: 36.1 g/dL — ABNORMAL HIGH (ref 30.0–36.0)
MCV: 90.6 fL (ref 80.0–100.0)
Monocytes Absolute: 0.7 10*3/uL (ref 0.1–1.0)
Monocytes Relative: 11 %
Neutro Abs: 4.4 10*3/uL (ref 1.7–7.7)
Neutrophils Relative %: 65 %
Platelets: 116 10*3/uL — ABNORMAL LOW (ref 150–400)
RBC: 5.11 MIL/uL (ref 4.22–5.81)
RDW: 12.7 % (ref 11.5–15.5)
WBC: 6.7 10*3/uL (ref 4.0–10.5)
nRBC: 0 % (ref 0.0–0.2)

## 2023-07-23 LAB — RESP PANEL BY RT-PCR (RSV, FLU A&B, COVID)  RVPGX2
Influenza A by PCR: NEGATIVE
Influenza B by PCR: NEGATIVE
Resp Syncytial Virus by PCR: NEGATIVE
SARS Coronavirus 2 by RT PCR: NEGATIVE

## 2023-07-23 LAB — GROUP A STREP BY PCR: Group A Strep by PCR: NOT DETECTED

## 2023-07-23 MED ORDER — DOXYCYCLINE HYCLATE 100 MG PO CAPS
100.0000 mg | ORAL_CAPSULE | Freq: Two times a day (BID) | ORAL | 0 refills | Status: AC
Start: 2023-07-23 — End: 2023-07-29
  Filled 2023-07-23: qty 10, 5d supply, fill #0

## 2023-07-23 MED ORDER — IOHEXOL 300 MG/ML  SOLN
100.0000 mL | Freq: Once | INTRAMUSCULAR | Status: AC | PRN
  Administered 2023-07-23: 75 mL via INTRAVENOUS

## 2023-07-23 MED ORDER — PREDNISONE 10 MG PO TABS
40.0000 mg | ORAL_TABLET | Freq: Every day | ORAL | 0 refills | Status: AC
  Filled 2023-07-23: qty 20, 5d supply, fill #0

## 2023-07-23 NOTE — Discharge Instructions (Addendum)
 Please follow-up with ENT regarding your laryngitis as you could benefit from a laryngoscopy of your vocal cords. Your laboratory evaluation was reassuring. Your CT scan revealed the following: 1. Question mild hazy stranding within the subcutaneous fat of the  chin/anterior submental region. Finding is nonspecific, but could  reflect changes of localized infection/cellulitis. Correlation with  physical exam recommended.  2. Mildly prominent left submental and right level IIa nodes,  nonspecific, but could be reactive.  3. Otherwise negative exam.  No discrete mass evident by CT.

## 2023-07-23 NOTE — ED Triage Notes (Signed)
 Sore throat since October painful swallowing started yesterday  Was on abt for same in December  Strep was neg in December  Concern that it may be throat CA, family history.  Former smoker, uses chew  No drooling

## 2023-07-23 NOTE — ED Provider Notes (Signed)
 [place] EMERGENCY DEPARTMENT AT [facility] Provider Note   CSN: [id number] Arrival date & time: 07/23/23  1536     History  Chief Complaint  Patient presents with   Sore Throat    Jeffrey Fischer is a 43 y.o. male.   Sore Throat     43 year old male presenting to the emergency department with a chief complaint of difficulty swallowing and laryngitis.  The patient has had symptoms since this past October.  He states that he is on 2 rounds of antibiotics with some temporary relief each time.  He is worried he may have oral cancer, endorses a history of smoking as well as dipping.  He endorses swelling in his neck, not particularly unilateral in nature, no fevers.  He requested testing for STIs.  He denies any weight loss, fever chills or night sweats.   Home Medications Prior to Admission medications   Medication Sig Start Date End Date Taking? Authorizing Provider  doxycycline  (VIBRAMYCIN ) 100 MG capsule Take 1 capsule (100 mg total) by mouth 2 (two) times daily for 5 days. 07/23/23 07/28/23 Yes Jerrol Agent, MD  predniSONE  (DELTASONE ) 10 MG tablet Take 4 tablets (40 mg total) by mouth daily for 5 days. 07/23/23 07/28/23 Yes Jerrol Agent, MD  benzonatate  (TESSALON ) 100 MG capsule Take 1-2 capsules (100-200 mg total) by mouth 3 (three) times daily as needed. 05/14/23   Vivienne Delon HERO, PA-C  Evolocumab  (REPATHA  SURECLICK) 140 MG/ML SOAJ Inject 140 mg into the skin every 14 (fourteen) days. Repeat labs 2 months after restarting 06/22/23   Mauro Elveria BROCKS, NP  hydrocortisone  (ANUSOL -HC) 25 MG suppository Place 1 suppository (25 mg total) rectally 2 (two) times daily. 03/02/23   Cook, Jayce G, DO      Allergies    Patient has no known allergies.    Review of Systems   Review of Systems  HENT:  Positive for trouble swallowing.   All other systems reviewed and are negative.   Physical Exam Updated Vital Signs BP (!) 143/74 (BP Location: Left Arm)   Pulse 89    Temp 98.3 F (36.8 C) (Oral)   Resp 18   SpO2 100%  Physical Exam Vitals and nursing note reviewed.  Constitutional:      General: He is not in acute distress.    Appearance: He is well-developed.  HENT:     Head: Normocephalic and atraumatic.  Eyes:     Conjunctiva/sclera: Conjunctivae normal.  Neck:     Thyroid: No thyroid mass, thyromegaly or thyroid tenderness.     Trachea: Trachea normal.     Comments:  Throat without exudate, uvula midline, mild oropharyngeal erythema, no elevation of the tongue, range of motion of the neck intact. Cardiovascular:     Rate and Rhythm: Normal rate and regular rhythm.  Pulmonary:     Effort: Pulmonary effort is normal. No respiratory distress.     Breath sounds: Normal breath sounds.  Abdominal:     Palpations: Abdomen is soft.     Tenderness: There is no abdominal tenderness.  Musculoskeletal:        General: No swelling.     Cervical back: Full passive range of motion without pain and neck supple. No erythema.  Lymphadenopathy:     Cervical: No cervical adenopathy.  Skin:    General: Skin is warm and dry.     Capillary Refill: Capillary refill takes less than 2 seconds.  Neurological:     Mental Status: He is alert.  Psychiatric:        Mood and Affect: Mood normal.     ED Results / Procedures / Treatments   Labs (all labs ordered are listed, but only abnormal results are displayed) Labs Reviewed  CBC WITH DIFFERENTIAL/PLATELET - Abnormal; Notable for the following components:      Result Value   MCHC 36.1 (*)    Platelets 116 (*)    All other components within normal limits  COMPREHENSIVE METABOLIC PANEL - Abnormal; Notable for the following components:   AST 114 (*)    ALT 98 (*)    Total Bilirubin 3.3 (*)    All other components within normal limits  GROUP A STREP BY PCR  RESP PANEL BY RT-PCR (RSV, FLU A&B, COVID)  RVPGX2  RPR  GC/CHLAMYDIA PROBE AMP ([place]) NOT AT [facility]    EKG None  Radiology CT Soft  Tissue Neck W Contrast Result Date: 07/23/2023 CLINICAL DATA:  Initial evaluation for sore throat, concern for malignancy. EXAM: CT NECK WITH CONTRAST TECHNIQUE: Multidetector CT imaging of the neck was performed using the standard protocol following the bolus administration of intravenous contrast. RADIATION DOSE REDUCTION: This exam was performed according to the departmental dose-optimization program which includes automated exposure control, adjustment of the mA and/or kV according to patient size and/or use of iterative reconstruction technique. CONTRAST:  75mL OMNIPAQUE  IOHEXOL  300 MG/ML  SOLN COMPARISON:  None. FINDINGS: Pharynx and larynx: Oral cavity within normal limits without visible discrete mass or collection. Palatine tonsils fairly symmetric without visible mass or acute inflammatory changes. Parapharyngeal fat maintained. Nasopharynx within normal limits. No retropharyngeal collection or swelling. Epiglottis within normal limits. Hypopharynx and supraglottic larynx within normal limits. Glottis grossly symmetric and normal. Subglottic airway clear. No visible discrete mass by CT. Salivary glands: Salivary glands including the parotid and submandibular glands are within normal limits. Thyroid: Normal. Lymph nodes: Mildly prominent left submental nodes measure up to 8 mm (series 2, image 87). Mildly prominent right level II a node measures 1.2 cm in short axis (series 2, image 66). No other enlarged or pathologic adenopathy. Vascular: Normal intravascular enhancement seen throughout the neck. Mild with somewhat advanced for age atheromatous change about the carotid bifurcations bilaterally. Limited intracranial: Unremarkable. Visualized orbits: Unremarkable. Mastoids and visualized paranasal sinuses: Paranasal sinuses are clear. Mastoid air cells and middle ear cavities are well pneumatized and free of fluid. Skeleton: No discrete or worrisome osseous lesions. Mild degenerative spondylosis present at  C5-6. Upper chest: No acute finding. Other: Question mild hazy stranding within the subcutaneous fat of the chin/anterior submental region (series 2, image 83). Finding is nonspecific, but could reflect changes of localized infection/cellulitis. IMPRESSION: 1. Question mild hazy stranding within the subcutaneous fat of the chin/anterior submental region. Finding is nonspecific, but could reflect changes of localized infection/cellulitis. Correlation with physical exam recommended. 2. Mildly prominent left submental and right level IIa nodes, nonspecific, but could be reactive. 3. Otherwise negative exam.  No discrete mass evident by CT. Electronically Signed   By: Morene Hoard M.D.   On: 07/23/2023 21:49    Procedures Procedures    Medications Ordered in ED Medications  iohexol  (OMNIPAQUE ) 300 MG/ML solution 100 mL (75 mLs Intravenous Contrast Given 07/23/23 2107)    ED Course/ Medical Decision Making/ A&P                                 Medical Decision Making Amount  and/or Complexity of Data Reviewed Radiology: ordered.  Risk Prescription drug management.     43 year old male presenting to the emergency department with a chief complaint of difficulty swallowing and laryngitis.  The patient has had symptoms since this past October.  He states that he is on 2 rounds of antibiotics with some temporary relief each time.  He is worried he may have oral cancer, endorses a history of smoking as well as dipping.  He endorses swelling in his neck, not particularly unilateral in nature, no fevers.  He requested testing for STIs.  He denies any weight loss, fever chills or night sweats.  On arrival, the patient was vitally stable, physical exam revealed Throat without exudate, uvula midline, mild oropharyngeal erythema, no elevation of the tongue, range of motion of the neck intact.  Differential includes neck infection, viral infection, laryngitis, vocal cord dysfunction, vocal cord polyp,  malignancy.  Will obtain CT of the neck soft tissue with contrast to further evaluate in addition to screening labs.  Laboratory evaluation revealed strep negative, COVID influenza and RSV PCR testing negative, CMP with mild elevation in a LFTs with an AST of 114, ALT of 98, CBC without a leukocytosis or anemia.  GC/committee testing was collected and pending and RPR was collected and pending.  CT of the neck soft tissue: IMPRESSION:  1. Question mild hazy stranding within the subcutaneous fat of the  chin/anterior submental region. Finding is nonspecific, but could  reflect changes of localized infection/cellulitis. Correlation with  physical exam recommended.  2. Mildly prominent left submental and right level IIa nodes,  nonspecific, but could be reactive.  3. Otherwise negative exam.  No discrete mass evident by CT.    Given the finding on CT, lower concern for cellulitis, low concern for Ludwig's Angina, no evidence of PTA, low concern for RPA with no evidence on CT.  Will discharge the patient on a course of doxycycline  and prednisone  given the questionable findings of cellulitis in the submental region.  Patient phonating appropriately, protecting his airway in no distress.  Recommended outpatient follow-up and consideration for laryngoscopy given his laryngitis for several months which could indicate vocal cord dysfunction or polyp.  Return precautions provided in the event of any severe worsening symptoms.   Final Clinical Impression(s) / ED Diagnoses Final diagnoses:  Throat swelling  Laryngitis    Rx / DC Orders ED Discharge Orders          Ordered    doxycycline  (VIBRAMYCIN ) 100 MG capsule  2 times daily        07/23/23 2208    predniSONE  (DELTASONE ) 10 MG tablet  Daily        07/23/23 2208              Jerrol Agent, MD 07/23/23 2339

## 2023-07-23 NOTE — ED Provider Triage Note (Addendum)
 Emergency Medicine Provider Triage Evaluation Note  Jeffrey Fischer , a 43 y.o. male  was evaluated in triage.  Pt complains of difficulty swallowing since Oct. Has been on multiple abx with temporary relief each time. Symptoms recurred yesterday. He is concerned for STI or oral cancer.  Review of Systems  Positive:  Negative:   Physical Exam  BP (!) 156/96 (BP Location: Right Arm)   Pulse (!) 111   Temp 98.9 F (37.2 C)   Resp 18   SpO2 100%  Gen:   Awake, no distress   Resp:  Normal effort, no stridor  MSK:   Moves extremities without difficulty  Other:  Throat without exudate, uvula midline, some pharyngeal erythema, no lymphadenopathy   Medical Decision Making  Medically screening exam initiated at 3:54 PM.  Appropriate orders placed.  Jeffrey Fischer was informed that the remainder of the evaluation will be completed by another provider, this initial triage assessment does not replace that evaluation, and the importance of remaining in the ED until their evaluation is complete.     Donnajean Lynwood DEL, PA-C 07/23/23 1602    Donnajean Lynwood DEL, PA-C 07/23/23 1611

## 2023-07-24 ENCOUNTER — Other Ambulatory Visit: Payer: Self-pay

## 2023-07-24 ENCOUNTER — Other Ambulatory Visit (HOSPITAL_BASED_OUTPATIENT_CLINIC_OR_DEPARTMENT_OTHER): Payer: Self-pay

## 2023-07-24 LAB — GC/CHLAMYDIA PROBE AMP (~~LOC~~) NOT AT ARMC
Chlamydia: NEGATIVE
Comment: NEGATIVE
Comment: NORMAL
Neisseria Gonorrhea: NEGATIVE

## 2023-07-24 LAB — RPR: RPR Ser Ql: NONREACTIVE

## 2023-08-03 ENCOUNTER — Ambulatory Visit: Payer: Self-pay | Admitting: Surgery

## 2023-09-03 ENCOUNTER — Ambulatory Visit: Payer: 59 | Admitting: Family Medicine

## 2023-09-07 ENCOUNTER — Ambulatory Visit: Payer: 59 | Admitting: Family Medicine

## 2023-09-13 ENCOUNTER — Institutional Professional Consult (permissible substitution) (INDEPENDENT_AMBULATORY_CARE_PROVIDER_SITE_OTHER): Payer: Self-pay | Admitting: Otolaryngology
